# Patient Record
Sex: Female | Born: 2000 | Race: Black or African American | Hispanic: No | Marital: Single | Smoking: Never smoker
Health system: Southern US, Community
[De-identification: ages and names within clinical notes are randomized; demographics above are authoritative.]

## PROBLEM LIST (undated history)

## (undated) DIAGNOSIS — Z789 Other specified health status: Secondary | ICD-10-CM

## (undated) HISTORY — DX: Other specified health status: Z78.9

## (undated) HISTORY — PX: NO PAST SURGERIES: SHX2092

---
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
## 2018-01-10 ENCOUNTER — Telehealth: Payer: Self-pay | Admitting: Licensed Clinical Social Worker

## 2018-01-10 ENCOUNTER — Ambulatory Visit: Payer: Self-pay | Admitting: Obstetrics and Gynecology

## 2018-01-10 NOTE — Telephone Encounter (Signed)
CSW A. Felton Clinton called phone number on file to reschedule appt. Unable to reach patient and could not leave voicemail for callback.

## 2018-05-10 ENCOUNTER — Encounter (HOSPITAL_COMMUNITY): Payer: Self-pay | Admitting: Emergency Medicine

## 2018-05-10 ENCOUNTER — Emergency Department (HOSPITAL_COMMUNITY)
Admission: EM | Admit: 2018-05-10 | Discharge: 2018-05-11 | Disposition: A | Discharge status: Home / Self Care | Payer: Self-pay | Attending: Emergency Medicine | Admitting: Emergency Medicine

## 2018-05-10 ENCOUNTER — Other Ambulatory Visit: Payer: Self-pay

## 2018-05-10 DIAGNOSIS — Z789 Other specified health status: Secondary | ICD-10-CM

## 2018-05-10 DIAGNOSIS — N83201 Unspecified ovarian cyst, right side: Secondary | ICD-10-CM | POA: Insufficient documentation

## 2018-05-10 LAB — CBC WITH DIFFERENTIAL/PLATELET
Abs Immature Granulocytes: 0.02 10*3/uL (ref 0.00–0.07)
Basophils Absolute: 0 10*3/uL (ref 0.0–0.1)
Basophils Relative: 0 %
Eosinophils Absolute: 0.1 10*3/uL (ref 0.0–0.5)
Eosinophils Relative: 1 %
HCT: 40.7 % (ref 36.0–46.0)
HEMOGLOBIN: 13.1 g/dL (ref 12.0–15.0)
Immature Granulocytes: 0 %
Lymphocytes Relative: 36 %
Lymphs Abs: 2.9 10*3/uL (ref 0.7–4.0)
MCH: 32 pg (ref 26.0–34.0)
MCHC: 32.2 g/dL (ref 30.0–36.0)
MCV: 99.5 fL (ref 80.0–100.0)
MONOS PCT: 9 %
Monocytes Absolute: 0.7 10*3/uL (ref 0.1–1.0)
Neutro Abs: 4.3 10*3/uL (ref 1.7–7.7)
Neutrophils Relative %: 54 %
Platelets: 251 10*3/uL (ref 150–400)
RBC: 4.09 MIL/uL (ref 3.87–5.11)
RDW: 11.6 % (ref 11.5–15.5)
WBC: 8 10*3/uL (ref 4.0–10.5)
nRBC: 0 % (ref 0.0–0.2)

## 2018-05-10 LAB — URINALYSIS, ROUTINE W REFLEX MICROSCOPIC
Bilirubin Urine: NEGATIVE
Glucose, UA: NEGATIVE mg/dL
Hgb urine dipstick: NEGATIVE
Ketones, ur: NEGATIVE mg/dL
Leukocytes, UA: NEGATIVE
Nitrite: NEGATIVE
Protein, ur: NEGATIVE mg/dL
Specific Gravity, Urine: 1.026 (ref 1.005–1.030)
pH: 6 (ref 5.0–8.0)

## 2018-05-10 LAB — BASIC METABOLIC PANEL
Anion gap: 7 (ref 5–15)
BUN: 15 mg/dL (ref 6–20)
CO2: 23 mmol/L (ref 22–32)
Calcium: 9.1 mg/dL (ref 8.9–10.3)
Chloride: 109 mmol/L (ref 98–111)
Creatinine, Ser: 0.77 mg/dL (ref 0.44–1.00)
GFR calc non Af Amer: >60 mL/min (ref >60)
Glucose, Bld: 99 mg/dL (ref 70–99)
Potassium: 4.1 mmol/L (ref 3.5–5.1)
SODIUM: 139 mmol/L (ref 135–145)

## 2018-05-10 LAB — I-STAT BETA HCG BLOOD, ED (MC, WL, AP ONLY): I-stat hCG, quantitative: <5 m[IU]/mL (ref <5)

## 2018-05-10 LAB — WET PREP, GENITAL
Clue Cells Wet Prep HPF POC: NONE SEEN
SPERM: NONE SEEN
Trich, Wet Prep: NONE SEEN
WBC, Wet Prep HPF POC: NONE SEEN
Yeast Wet Prep HPF POC: NONE SEEN

## 2018-05-10 NOTE — ED Provider Notes (Signed)
[facility]-EMERGENCY DEPT Provider Note   CSN: [id number] Arrival date & time: 05/10/18  1949     History   Chief Complaint Chief Complaint  Patient presents with  . Vaginal Bleeding    HPI Judith Evans is a 18 y.o. female who presents the emergency department with chief complaint of right quadrant abdominal pain.  Patient states that she had an irregularly heavy.  Which started on 04/27/2018.  She states that her periods are usually irregular.  She does not have any active vaginal bleeding.  She has had 2 days of mild to moderate colicky in nature right lower quadrant abdominal pain.  Nothing seems to make it worse or better.  She denies flank pain or urinary symptoms.  She denies nausea vomiting fever or chills.  Patient denies vaginal symptoms such as foul odor or discharge.  HPI  History reviewed. No pertinent past medical history.  There are no active problems to display for this patient.   History reviewed. No pertinent surgical history.   OB History   No obstetric history on file.      Home Medications    Prior to Admission medications   Medication Sig Start Date End Date Taking? Authorizing Provider  naproxen sodium (ALEVE) 220 MG tablet Take 440 mg by mouth 2 (two) times daily as needed (pain).   Yes [provider]    Family History No family history on file.  Social History Social History   Tobacco Use  . Smoking status: Not on file  Substance Use Topics  . Alcohol use: Not on file  . Drug use: Not on file     Allergies   Patient has no known allergies.   Review of Systems Review of Systems  Ten systems reviewed and are negative for acute change, except as noted in the HPI.   Physical Exam Updated Vital Signs BP 118/72 (BP Location: Left Arm)   Pulse 78   Temp 98.7 F (37.1 C) (Oral)   Resp 14   Ht 4\' 11"  (1.499 m)   Wt 47.6 kg   LMP 04/27/2018   SpO2 100%   BMI 21.21 kg/m   Physical Exam Physical  Exam  Nursing note and vitals reviewed. Constitutional: She is oriented to person, place, and time. She appears well-developed and well-nourished. No distress.  HENT:  Head: Normocephalic and atraumatic.  Eyes: Conjunctivae normal and EOM are normal. Pupils are equal, round, and reactive to light. No scleral icterus.  Neck: Normal range of motion.  Cardiovascular: Normal rate, regular rhythm and normal heart sounds.  Exam reveals no gallop and no friction rub.   No murmur heard. Pulmonary/Chest: Effort normal and breath sounds normal. No respiratory distress.  Abdominal: Soft. Bowel sounds are normal. She exhibits no distension and no mass. There is no tenderness. There is no guarding.  Neurological: She is alert and oriented to person, place, and time.Female genitalia: Vulva: normal appearing vulva with no masses, tenderness or lesions Vagina: normal appearing vagina with normal color and discharge, no lesions Cervix: normal appearing cervix without discharge or lesions, cervical motion tenderness absent and nulliparous os Adnexa: no masses and tenderness right   Skin: Skin is warm and dry. She is not diaphoretic.     ED Treatments / Results  Labs (all labs ordered are listed, but only abnormal results are displayed) Labs Reviewed  URINALYSIS, ROUTINE W REFLEX MICROSCOPIC - Abnormal; Notable for the following components:      Result Value  APPearance HAZY (*)    All other components within normal limits  WET PREP, GENITAL  CBC WITH DIFFERENTIAL/PLATELET  BASIC METABOLIC PANEL  I-STAT BETA HCG BLOOD, ED ([facility], [facility], [facility] ONLY)  GC/CHLAMYDIA PROBE AMP () NOT AT [facility]    EKG None  Radiology No results found.  Procedures Procedures (including critical care time)  Medications Ordered in ED Medications - No data to display   Initial Impression / Assessment and Plan / ED Course  I have reviewed the triage vital signs and the nursing notes.  Pertinent labs & imaging  results that were available during my care of the patient were reviewed by me and considered in my medical decision making (see chart for details).     18 year old female with right lower quadrant abdominal pain. Differential diagnosis of her lower abdominal considerations include pelvic inflammatory disease, ectopic pregnancy, appendicitis, urinary calculi, primary dysmenorrhea, septic abortion, ruptured ovarian cyst or tumor, ovarian torsion, tubo-ovarian abscess, degeneration of fibroid, endometriosis, diverticulitis, cystitis. Her abdominal examination is benign.  Her right right adnexa was tender without masses no cervical motion tenderness doubt PID or tubo-ovarian abscess.  Ultrasound performed which shows right ovarian cyst which is likely the cause of her pain.  Labs reviewed and showed no significant abnormalities.  Normal wet prep and patient has negative pregnancy examination.  Final Clinical Impressions(s) / ED Diagnoses   Final diagnoses:  None    ED Discharge Orders    None       Arthor Captain, PA-C 05/11/18 3833    Tegeler, Canary Brim, MD 05/11/18 1023

## 2018-05-10 NOTE — ED Triage Notes (Signed)
Pt arriving with complaint of right sided abdominal pain and reports vaginal "blood clots".

## 2018-05-11 ENCOUNTER — Emergency Department (HOSPITAL_COMMUNITY): Payer: Self-pay

## 2018-05-11 LAB — GC/CHLAMYDIA PROBE AMP (~~LOC~~) NOT AT ARMC
Chlamydia: POSITIVE — AB
Neisseria Gonorrhea: NEGATIVE

## 2018-05-11 NOTE — Discharge Instructions (Addendum)
Get help right away if: You have abdominal pain that is severe or gets worse. You cannot eat or drink without vomiting. You suddenly develop a fever. Your menstrual period is much heavier than usual 

## 2018-05-13 ENCOUNTER — Telehealth: Payer: Self-pay | Admitting: Medical

## 2018-05-13 DIAGNOSIS — A749 Chlamydial infection, unspecified: Secondary | ICD-10-CM

## 2018-05-13 MED ORDER — AZITHROMYCIN 250 MG PO TABS: 1000.0000 mg | ORAL_TABLET | Freq: Once | ORAL | 0 refills | Status: AC

## 2018-05-13 NOTE — Telephone Encounter (Addendum)
Judith Evans tested positive for  Chlamydia. Patient was called by RN and allergies and pharmacy confirmed. Rx sent to pharmacy of choice.   Marny Lowenstein, PA-C 05/13/2018 11:43 AM       ----- Message from Judith Becton, RN sent at 05/13/2018 10:57 AM EST ----- This patient tested positive for:  Chlamydia  She "has NKDA", I have informed the patient of her results and confirmed her pharmacy is correct in her chart. Please send Rx.   Thank you,   Judith Becton, RN   Results faxed to [facility].

## 2018-05-19 ENCOUNTER — Other Ambulatory Visit: Payer: Self-pay

## 2018-05-19 ENCOUNTER — Encounter (HOSPITAL_COMMUNITY): Payer: Self-pay | Admitting: *Deleted

## 2018-05-19 ENCOUNTER — Emergency Department (HOSPITAL_COMMUNITY)
Admission: EM | Admit: 2018-05-19 | Discharge: 2018-05-19 | Disposition: A | Discharge status: Home / Self Care | Payer: Medicaid Other | Attending: Emergency Medicine | Admitting: Emergency Medicine

## 2018-05-19 DIAGNOSIS — R1030 Lower abdominal pain, unspecified: Secondary | ICD-10-CM | POA: Insufficient documentation

## 2018-05-19 DIAGNOSIS — R110 Nausea: Secondary | ICD-10-CM | POA: Insufficient documentation

## 2018-05-19 MED ORDER — CEFTRIAXONE SODIUM 250 MG IJ SOLR
250.0000 mg | Freq: Once | INTRAMUSCULAR | Status: AC
  Administered 2018-05-19: 250 mg via INTRAMUSCULAR
  Filled 2018-05-19: qty 250

## 2018-05-19 MED ORDER — DOXYCYCLINE HYCLATE 100 MG PO CAPS: 100.0000 mg | ORAL_CAPSULE | Freq: Two times a day (BID) | ORAL | 0 refills | Status: AC

## 2018-05-19 MED ORDER — LIDOCAINE HCL 1 % IJ SOLN
INTRAMUSCULAR | Status: AC
  Administered 2018-05-19: 0.9 mL
  Filled 2018-05-19: qty 20

## 2018-05-19 NOTE — Discharge Instructions (Signed)
Take doxycycline as prescribed until finished for management of presumed pelvic inflammatory disease.  Continue naproxen for pain control.  Follow-up with [facility] as scheduled.

## 2018-05-19 NOTE — ED Provider Notes (Signed)
[facility]-EMERGENCY DEPT Provider Note   CSN: [id number] Arrival date & time: 05/19/18  7026     History   Chief Complaint No chief complaint on file.   HPI Judith Evans is a 18 y.o. female.  18 year old female with no significant past medical history presents to the emergency department for evaluation of persistent lower abdominal cramping.  She states that cramping has been intermittent since previously seen on 05/11/2018.  She has been taking Aleve for symptoms with little relief.  Symptoms have been associated with nausea without vomiting.  She had some vaginal bleeding when seen previously which has resolved.  During this encounter, patient underwent pelvic ultrasound which showed a 2 cm simple ovarian cyst.  She took 1000 mg azithromycin on 05/13/2018 after tests returned positive for chlamydia.  She has not been sexually active since last evaluated.  Denies any fevers, bowel changes, dysuria, hematuria.  No history of abdominal surgeries.  The history is provided by the patient. No language interpreter was used.    History reviewed. No pertinent past medical history.  There are no active problems to display for this patient.   History reviewed. No pertinent surgical history.   OB History   No obstetric history on file.      Home Medications    Prior to Admission medications   Medication Sig Start Date End Date Taking? Authorizing Provider  naproxen sodium (ALEVE) 220 MG tablet Take 440 mg by mouth 2 (two) times daily as needed (pain).   Yes [provider]  doxycycline (VIBRAMYCIN) 100 MG capsule Take 1 capsule (100 mg total) by mouth 2 (two) times daily for 14 days. 05/19/18 06/02/18  Antony Madura, PA-C    Family History No family history on file.  Social History Social History   Tobacco Use  . Smoking status: Never Smoker  . Smokeless tobacco: Never Used  Substance Use Topics  . Alcohol use: Never    Frequency: Never  . Drug  use: Never     Allergies   Patient has no known allergies.   Review of Systems Review of Systems Ten systems reviewed and are negative for acute change, except as noted in the HPI.    Physical Exam Updated Vital Signs BP 113/80 (BP Location: Left Arm)   Pulse 88   Temp 98.6 F (37 C) (Oral)   Resp 18   Ht 4\' 11"  (1.499 m)   Wt 47.6 kg   LMP 04/27/2018   SpO2 97%   BMI 21.21 kg/m   Physical Exam Vitals signs and nursing note reviewed.  Constitutional:      General: She is not in acute distress.    Appearance: She is well-developed. She is not diaphoretic.     Comments: Nontoxic appearing and in NAD  HENT:     Head: Normocephalic and atraumatic.  Eyes:     General: No scleral icterus.    Conjunctiva/sclera: Conjunctivae normal.  Neck:     Musculoskeletal: Normal range of motion.  Cardiovascular:     Rate and Rhythm: Normal rate and regular rhythm.     Pulses: Normal pulses.  Pulmonary:     Effort: Pulmonary effort is normal. No respiratory distress.     Comments: Respirations even and unlabored Abdominal:     General: There is no distension.     Palpations: Abdomen is soft. There is no mass.     Tenderness: There is no guarding.       Comments: Abdomen soft, nondistended.  Minimal TTP in the right suprapubic abdomen.  Musculoskeletal: Normal range of motion.  Skin:    General: Skin is warm and dry.     Coloration: Skin is not pale.     Findings: No erythema or rash.  Neurological:     Mental Status: She is alert and oriented to person, place, and time.     Coordination: Coordination normal.  Psychiatric:        Behavior: Behavior normal.      ED Treatments / Results  Labs (all labs ordered are listed, but only abnormal results are displayed) Labs Reviewed - No data to display  EKG None  Radiology No results found.  Procedures Procedures (including critical care time)  Medications Ordered in ED Medications  cefTRIAXone (ROCEPHIN)  injection 250 mg (has no administration in time range)     Initial Impression / Assessment and Plan / ED Course  I have reviewed the triage vital signs and the nursing notes.  Pertinent labs & imaging results that were available during my care of the patient were reviewed by me and considered in my medical decision making (see chart for details).     18 year old female presents for persistent lower abdominal pain.  She was given 1000 mg azithromycin on 05/13/2018 after she tested positive for chlamydia.  Also noted to have right simple ovarian cyst on ultrasound from 05/11/2018.  She has a soft, nondistended abdomen.  There is minimal tenderness in the right suprapubic abdomen.  She has no focal tenderness at McBurney's point.  Laboratory tests from 1 week ago reviewed which were reassuring.  She denies any urinary symptoms.  She had a previously negative pregnancy test and has not been sexually active since her last encounter.  Given her ongoing lower abdominal cramping, plan to treat for presumed PID.  She has been given a dose of Rocephin in the emergency department.  Will discharge with doxycycline.  Patient encouraged to continue use of naproxen.  [facility]  Return precautions discussed and provided. Patient discharged in stable condition with no unaddressed concerns.   Final Clinical Impressions(s) / ED Diagnoses   Final diagnoses:  Lower abdominal pain    ED Discharge Orders         Ordered    doxycycline (VIBRAMYCIN) 100 MG capsule  2 times daily     05/19/18 0207           Antony MaduraHumes, Sutton Plake, PA-C 05/19/18 0224    Marily MemosMesner, Jason, MD 05/19/18 [phone]

## 2018-05-19 NOTE — ED Triage Notes (Signed)
Pt states she was seen last week and told she had cysts on her ovaries.  She was told that if she still had pain or had nausea she should come back.  Pt is here for her pain that has not changed since she was last seen. Pt states that she can't get in with [facility] until February. Pt reports nausea without emesis.  Rates pain a 4 out of 10.

## 2018-09-15 DIAGNOSIS — N83201 Unspecified ovarian cyst, right side: Secondary | ICD-10-CM | POA: Insufficient documentation

## 2018-09-20 ENCOUNTER — Emergency Department (HOSPITAL_COMMUNITY)
Admission: EM | Admit: 2018-09-20 | Discharge: 2018-09-20 | Disposition: A | Discharge status: Home / Self Care | Payer: Self-pay | Attending: Emergency Medicine | Admitting: Emergency Medicine

## 2018-09-20 ENCOUNTER — Emergency Department (HOSPITAL_COMMUNITY): Payer: Self-pay

## 2018-09-20 ENCOUNTER — Other Ambulatory Visit: Payer: Self-pay

## 2018-09-20 ENCOUNTER — Encounter (HOSPITAL_COMMUNITY): Payer: Self-pay | Admitting: Emergency Medicine

## 2018-09-20 DIAGNOSIS — R109 Unspecified abdominal pain: Secondary | ICD-10-CM

## 2018-09-20 DIAGNOSIS — R1031 Right lower quadrant pain: Secondary | ICD-10-CM | POA: Insufficient documentation

## 2018-09-20 LAB — URINALYSIS, ROUTINE W REFLEX MICROSCOPIC
Bilirubin Urine: NEGATIVE
Glucose, UA: NEGATIVE mg/dL
Hgb urine dipstick: NEGATIVE
Ketones, ur: 20 mg/dL — AB
Leukocytes,Ua: NEGATIVE
Nitrite: NEGATIVE
Protein, ur: NEGATIVE mg/dL
Specific Gravity, Urine: 1.027 (ref 1.005–1.030)
pH: 6 (ref 5.0–8.0)

## 2018-09-20 LAB — WET PREP, GENITAL
Clue Cells Wet Prep HPF POC: NONE SEEN
Sperm: NONE SEEN
Trich, Wet Prep: NONE SEEN
Yeast Wet Prep HPF POC: NONE SEEN

## 2018-09-20 LAB — I-STAT BETA HCG BLOOD, ED (MC, WL, AP ONLY): I-stat hCG, quantitative: <5 m[IU]/mL (ref <5)

## 2018-09-20 LAB — CBC
HCT: 40.2 % (ref 36.0–46.0)
Hemoglobin: 12.8 g/dL (ref 12.0–15.0)
MCH: 31.8 pg (ref 26.0–34.0)
MCHC: 31.8 g/dL (ref 30.0–36.0)
MCV: 100 fL (ref 80.0–100.0)
Platelets: 234 10*3/uL (ref 150–400)
RBC: 4.02 MIL/uL (ref 3.87–5.11)
RDW: 11.3 % — ABNORMAL LOW (ref 11.5–15.5)
WBC: 5.1 10*3/uL (ref 4.0–10.5)
nRBC: 0 % (ref 0.0–0.2)

## 2018-09-20 LAB — COMPREHENSIVE METABOLIC PANEL
ALT: 11 U/L (ref 0–44)
AST: 16 U/L (ref 15–41)
Albumin: 4.3 g/dL (ref 3.5–5.0)
Alkaline Phosphatase: 34 U/L — ABNORMAL LOW (ref 38–126)
Anion gap: 7 (ref 5–15)
BUN: 13 mg/dL (ref 6–20)
CO2: 22 mmol/L (ref 22–32)
Calcium: 8.7 mg/dL — ABNORMAL LOW (ref 8.9–10.3)
Chloride: 108 mmol/L (ref 98–111)
Creatinine, Ser: 0.77 mg/dL (ref 0.44–1.00)
GFR calc Af Amer: >60 mL/min (ref >60)
GFR calc non Af Amer: >60 mL/min (ref >60)
Glucose, Bld: 94 mg/dL (ref 70–99)
Potassium: 3.4 mmol/L — ABNORMAL LOW (ref 3.5–5.1)
Sodium: 137 mmol/L (ref 135–145)
Total Bilirubin: 0.7 mg/dL (ref 0.3–1.2)
Total Protein: 7.9 g/dL (ref 6.5–8.1)

## 2018-09-20 LAB — LIPASE, BLOOD: Lipase: 28 U/L (ref 11–51)

## 2018-09-20 MED ORDER — IOHEXOL 300 MG/ML  SOLN
30.0000 mL | Freq: Once | INTRAMUSCULAR | Status: AC | PRN
  Administered 2018-09-20: 30 mL via ORAL

## 2018-09-20 MED ORDER — ACETAMINOPHEN 500 MG PO TABS
1000.0000 mg | ORAL_TABLET | Freq: Once | ORAL | Status: AC
  Administered 2018-09-20: 1000 mg via ORAL
  Filled 2018-09-20: qty 2

## 2018-09-20 MED ORDER — IOHEXOL 300 MG/ML  SOLN
100.0000 mL | Freq: Once | INTRAMUSCULAR | Status: AC | PRN
  Administered 2018-09-20: 80 mL via INTRAVENOUS

## 2018-09-20 NOTE — ED Provider Notes (Signed)
[facility]-EMERGENCY DEPT Provider Note   CSN: [id number] Arrival date & time: 09/20/18  1429    History   Chief Complaint Chief Complaint  Patient presents with   Abdominal Pain    HPI Judith Evans is a 18 y.o. female.     HPI   Patient is a 18 year old female with past medical history of PID and ovarian cysts presenting for right lower quadrant and suprapubic abdominal pain.  Patient reports that it is been colicky in nature since yesterday afternoon.  She describes it as sharp when it is present.  Patient denies any dysuria, urgency or frequency.  She does report some slight increase in vaginal discharge for the past couple weeks.  Patient denies nausea vomiting but does report decreased appetite.  Reports last bowel movement yesterday and normal for patient.  Patient reports that she is sexually active with one female partner and performs unprotected vaginal sex.  Patient states that her menses have been irregular, but menses finished yesterday.  She is recently started back on oral contraceptive pills.  Denies abdominal surgical history.  History reviewed. No pertinent past medical history.  There are no active problems to display for this patient.   History reviewed. No pertinent surgical history.   OB History   No obstetric history on file.      Home Medications    Prior to Admission medications   Medication Sig Start Date End Date Taking? Authorizing Provider  CRYSELLE-28 0.3-30 MG-MCG tablet Take 1 tablet by mouth daily. 09/15/18  Yes [provider]    Family History No family history on file.  Social History Social History   Tobacco Use   Smoking status: Never Smoker   Smokeless tobacco: Never Used  Substance Use Topics   Alcohol use: Never    Frequency: Never   Drug use: Never     Allergies   Patient has no known allergies.   Review of Systems Review of Systems  Constitutional: Negative for chills and  fever.  Gastrointestinal: Positive for abdominal pain. Negative for abdominal distention, blood in stool, diarrhea, nausea and vomiting.  Genitourinary: Positive for menstrual problem, pelvic pain and vaginal discharge. Negative for dysuria, flank pain, frequency, urgency and vaginal bleeding.  All other systems reviewed and are negative.    Physical Exam Updated Vital Signs BP 107/63 (BP Location: Right Arm)    Pulse 87    Temp 98.9 F (37.2 C) (Oral)    Resp 15    SpO2 100%   Physical Exam Vitals signs and nursing note reviewed.  Constitutional:      General: She is not in acute distress.    Appearance: She is well-developed.  HENT:     Head: Normocephalic and atraumatic.  Eyes:     Conjunctiva/sclera: Conjunctivae normal.     Pupils: Pupils are equal, round, and reactive to light.  Neck:     Musculoskeletal: Normal range of motion and neck supple.  Cardiovascular:     Rate and Rhythm: Normal rate and regular rhythm.     Heart sounds: S1 normal and S2 normal. No murmur.  Pulmonary:     Effort: Pulmonary effort is normal.     Breath sounds: Normal breath sounds. No wheezing or rales.  Abdominal:     General: There is no distension.     Palpations: Abdomen is soft.     Tenderness: There is abdominal tenderness in the right lower quadrant. There is no guarding.     Comments:  Mild RLQ tenderness without guarding or rebound.   Genitourinary:    Comments: GU exam performed with RN chaperone present.  Patient has mobile horizontal chain inguinal lymphadenopathy.  Vaginal tissue pink and rugated.  Cervical os nonerythematous.  Small amount of vaginal discharge present in vaginal vault. On bimanual exam, patient has no cervical motion tenderness, but does have right adnexal TTP.  Musculoskeletal: Normal range of motion.        General: No deformity.  Lymphadenopathy:     Cervical: No cervical adenopathy.  Skin:    General: Skin is warm and dry.     Findings: No erythema or rash.    Neurological:     Mental Status: She is alert.     Comments: Cranial nerves grossly intact. Patient moves extremities symmetrically and with good coordination.  Psychiatric:        Behavior: Behavior normal.        Thought Content: Thought content normal.        Judgment: Judgment normal.      ED Treatments / Results  Labs (all labs ordered are listed, but only abnormal results are displayed) Labs Reviewed  WET PREP, GENITAL - Abnormal; Notable for the following components:      Result Value   WBC, Wet Prep HPF POC RARE (*)    All other components within normal limits  COMPREHENSIVE METABOLIC PANEL - Abnormal; Notable for the following components:   Potassium 3.4 (*)    Calcium 8.7 (*)    Alkaline Phosphatase 34 (*)    All other components within normal limits  CBC - Abnormal; Notable for the following components:   RDW 11.3 (*)    All other components within normal limits  URINALYSIS, ROUTINE W REFLEX MICROSCOPIC - Abnormal; Notable for the following components:   APPearance HAZY (*)    Ketones, ur 20 (*)    All other components within normal limits  LIPASE, BLOOD  RPR  HIV ANTIBODY (ROUTINE TESTING W REFLEX)  I-STAT BETA HCG BLOOD, ED ([facility], [facility], [facility] ONLY)  GC/CHLAMYDIA PROBE AMP ([place]) NOT AT [facility]    EKG None  Radiology US Transvaginal Non-ob  Result Date: 09/20/2018 CLINICAL DATA:  Initial evaluation for acute right adnexal pain. EXAM: TRANSABDOMINAL AND TRANSVAGINAL ULTRASOUND OF PELVIS DOPPLER ULTRASOUND OF OVARIES TECHNIQUE: Both transabdominal and transvaginal ultrasound examinations of the pelvis were performed. Transabdominal technique was performed for global imaging of the pelvis including uterus, ovaries, adnexal regions, and pelvic cul-de-sac. It was necessary to proceed with endovaginal exam following the transabdominal exam to visualize the uterus, endometrium, and ovaries. Color and duplex Doppler ultrasound was utilized to evaluate blood flow to the  ovaries. COMPARISON:  Prior ultrasound from 05/11/2018 FINDINGS: Uterus Measurements: 7.7 x 3.5 x 5.5 cm = volume: 77.1 mL. No fibroids or other mass visualized. Endometrium Thickness: 4.2 mm.  No focal abnormality visualized. Right ovary Measurements: 2.6 x 1.8 x 1.6 cm = volume: 3.9 mL. Normal appearance/no adnexal mass. Left ovary Measurements: 3.3 x 1.3 x 1.8 cm = volume: 4.1 mL. Normal appearance/no adnexal mass. Pulsed Doppler evaluation of both ovaries demonstrates normal low-resistance arterial and venous waveforms. Other findings Trace free physiologic fluid present within the pelvis. IMPRESSION: Normal pelvic ultrasound. No evidence for torsion or other acute abnormality. Electronically Signed   By: Rise Mu M.D.   On: 09/20/2018 18:03   US Pelvis Complete  Result Date: 09/20/2018 CLINICAL DATA:  Initial evaluation for acute right adnexal pain. EXAM: TRANSABDOMINAL AND TRANSVAGINAL ULTRASOUND  OF PELVIS DOPPLER ULTRASOUND OF OVARIES TECHNIQUE: Both transabdominal and transvaginal ultrasound examinations of the pelvis were performed. Transabdominal technique was performed for global imaging of the pelvis including uterus, ovaries, adnexal regions, and pelvic cul-de-sac. It was necessary to proceed with endovaginal exam following the transabdominal exam to visualize the uterus, endometrium, and ovaries. Color and duplex Doppler ultrasound was utilized to evaluate blood flow to the ovaries. COMPARISON:  Prior ultrasound from 05/11/2018 FINDINGS: Uterus Measurements: 7.7 x 3.5 x 5.5 cm = volume: 77.1 mL. No fibroids or other mass visualized. Endometrium Thickness: 4.2 mm.  No focal abnormality visualized. Right ovary Measurements: 2.6 x 1.8 x 1.6 cm = volume: 3.9 mL. Normal appearance/no adnexal mass. Left ovary Measurements: 3.3 x 1.3 x 1.8 cm = volume: 4.1 mL. Normal appearance/no adnexal mass. Pulsed Doppler evaluation of both ovaries demonstrates normal low-resistance arterial and venous  waveforms. Other findings Trace free physiologic fluid present within the pelvis. IMPRESSION: Normal pelvic ultrasound. No evidence for torsion or other acute abnormality. Electronically Signed   By: Rise Mu M.D.   On: 09/20/2018 18:03   [place] Art/ven Flow Abd Pelv Doppler  Result Date: 09/20/2018 CLINICAL DATA:  Initial evaluation for acute right adnexal pain. EXAM: TRANSABDOMINAL AND TRANSVAGINAL ULTRASOUND OF PELVIS DOPPLER ULTRASOUND OF OVARIES TECHNIQUE: Both transabdominal and transvaginal ultrasound examinations of the pelvis were performed. Transabdominal technique was performed for global imaging of the pelvis including uterus, ovaries, adnexal regions, and pelvic cul-de-sac. It was necessary to proceed with endovaginal exam following the transabdominal exam to visualize the uterus, endometrium, and ovaries. Color and duplex Doppler ultrasound was utilized to evaluate blood flow to the ovaries. COMPARISON:  Prior ultrasound from 05/11/2018 FINDINGS: Uterus Measurements: 7.7 x 3.5 x 5.5 cm = volume: 77.1 mL. No fibroids or other mass visualized. Endometrium Thickness: 4.2 mm.  No focal abnormality visualized. Right ovary Measurements: 2.6 x 1.8 x 1.6 cm = volume: 3.9 mL. Normal appearance/no adnexal mass. Left ovary Measurements: 3.3 x 1.3 x 1.8 cm = volume: 4.1 mL. Normal appearance/no adnexal mass. Pulsed Doppler evaluation of both ovaries demonstrates normal low-resistance arterial and venous waveforms. Other findings Trace free physiologic fluid present within the pelvis. IMPRESSION: Normal pelvic ultrasound. No evidence for torsion or other acute abnormality. Electronically Signed   By: Rise Mu M.D.   On: 09/20/2018 18:03    Procedures Procedures (including critical care time)  Medications Ordered in ED Medications  acetaminophen (TYLENOL) tablet 1,000 mg (1,000 mg Oral Given 09/20/18 1815)     Initial Impression / Assessment and Plan / ED Course  I have reviewed  the triage vital signs and the nursing notes.  Pertinent labs & imaging results that were available during my care of the patient were reviewed by me and considered in my medical decision making (see chart for details).  Clinical Course as of Sep 19 2144  Tue Sep 20, 2018  1715 Exam not c/w PID.   WBC, Wet Prep HPF POC(!): RARE [AM]  2046 Reports improvement in pain.   [AM]    Clinical Course User Index [AM] Elisha Ponder, PA-C       Patient is a well-appearing 18 year old female with past medical history of PID, chlamydia, and ovarian cyst presenting for ongoing right lower quadrant pain.  Differential diagnosis includes ruptured ovarian cyst, ovarian torsion, ureterolithiasis, appendicitis, PID, gastroenteritis.  She has a benign abdominal exam.  Pelvic examination is not concerning for PID.  Patient's work-up is demonstrating no leukocytosis.  She has a  slight hypokalemia of 3.4.  Urinalysis is negative without evidence of infection.  Wet prep demonstrating very few WBCs and no other acute findings.  Not consistent with PID or cervicitis.  Pelvic ultrasound unremarkable.  CT abdomen and pelvis without evidence of acute findings.  No findings to explain patient's right lower quadrant pain.  She did have improvement in emergency department course with Tylenol.  She was requesting POs, and tolerated well.   Patient can return precautions for any worsening pain, intractable nausea or vomiting, or fever with abdominal pain.  Patient is in understanding and agrees with plan of care.  Final Clinical Impressions(s) / ED Diagnoses   Final diagnoses:  Right sided abdominal pain    ED Discharge Orders    None       Delia ChimesMurray, Joyce Heitman B, PA-C 09/20/18 2146    Sabas SousBero, Michael M, MD 09/20/18 2318

## 2018-09-20 NOTE — ED Triage Notes (Signed)
Pt reports since last night started having right side abd pains again. Wants to know if her cyst is back again.

## 2018-09-20 NOTE — Discharge Instructions (Signed)
Please see the information and instructions below regarding your visit.  Your diagnoses today include:  1. RLQ abdominal pain     Your exam and testing today is reassuring that there is not a condition causing your abdominal pain that we immediately need to intervene on at this time.   Abdominal (belly) pain can be caused by many things. Your caregiver performed an examination and possibly ordered blood/urine tests and imaging (CT scan, x-rays, ultrasound). Many cases can be observed and treated at home after initial evaluation in the emergency department. Even though you are being discharged home, abdominal pain can be unpredictable. Therefore, you need a repeated exam if your pain does not resolve, returns, or worsens. Most patients with abdominal pain don't have to be admitted to the hospital or have surgery, but serious problems like appendicitis and gallbladder attacks can start out as nonspecific pain. Many abdominal conditions cannot be diagnosed in one visit, so follow-up evaluations are very important.  Tests performed today include: Blood counts and electrolytes Blood tests to check liver and kidney function Blood tests to check pancreas function Urine test to look for infection and pregnancy (in women) Vital signs. See below for your results today.   See side panel of your discharge paperwork for testing performed today. Vital signs are listed at the bottom of these instructions.   Medications prescribed:    Take any prescribed medications only as prescribed, and any over the counter medications only as directed on the packaging.  I recommend taking Gas-X (Simethicone) as needed.  This is an over-the-counter medication.  This helps disperse gas bubbles that they are the cause of your pain.  You may also take Tylenol, 650 mg every 6 hours as needed for pain.  Home care instructions:  Try eating, but start with foods that have a lot of fluid in them. Good examples are soup,  Jell-O, and popsicles. If you do OK with those foods, you can try soft, bland foods, such as plain yogurt. Foods that are high in carbohydrates ("carbs"), like bread or saltine crackers, can help settle your stomach. Some people also find that ginger helps with nausea. You should avoid foods that have a lot of fat in them. They can make nausea worse. Call your doctor if your symptoms come back when you try to eat.  Please follow any educational materials contained in this packet.   Follow-up instructions: Please follow-up with your primary care provider in the next couple of days as needed for further evaluation of your symptoms if they are not completely improved.    Return instructions:  Please return to the Emergency Department if you experience worsening symptoms.  SEEK IMMEDIATE MEDICAL ATTENTION IF: The pain does not go away or becomes severe  A temperature above 101F develops  Repeated vomiting occurs (multiple episodes)  The pain becomes localized to portions of the abdomen. The right side could possibly be appendicitis. In an adult, the left lower portion of the abdomen could be colitis or diverticulitis.  Blood is being passed in stools or vomit (bright red or black tarry stools)  You develop chest pain, difficulty breathing, dizziness or fainting, or become confused, poorly responsive, or inconsolable (young children) If you have any other emergent concerns regarding your health  Additional Information:   Your vital signs today were: BP 103/67    Pulse 79    Temp 98.9 F (37.2 C) (Oral)    Resp 15    LMP 09/13/2018    SpO2  100%  If your blood pressure (BP) was elevated on multiple readings during this visit above 130 for the top number or above 80 for the bottom number, please have this repeated by your primary care provider within one month. --------------  Thank you for allowing us to participate in your care today.

## 2018-09-21 LAB — RPR: RPR Ser Ql: NONREACTIVE

## 2018-09-21 LAB — HIV ANTIBODY (ROUTINE TESTING W REFLEX): HIV Screen 4th Generation wRfx: NONREACTIVE

## 2018-09-22 LAB — GC/CHLAMYDIA PROBE AMP (~~LOC~~) NOT AT ARMC
Chlamydia: NEGATIVE
Neisseria Gonorrhea: NEGATIVE

## 2018-09-23 ENCOUNTER — Telehealth: Payer: Self-pay | Admitting: *Deleted

## 2018-09-23 NOTE — Telephone Encounter (Signed)
Pt called regarding her lab results from 09/20/2018. Advised her to activate her MyChart to see if her results are there and if not to called the ED at [facility] back regarding her lab results. Pt voiced understanding.

## 2018-10-27 ENCOUNTER — Emergency Department (HOSPITAL_COMMUNITY)
Admission: EM | Admit: 2018-10-27 | Discharge: 2018-10-28 | Disposition: A | Discharge status: Home / Self Care | Payer: Self-pay | Attending: Emergency Medicine | Admitting: Emergency Medicine

## 2018-10-27 ENCOUNTER — Other Ambulatory Visit: Payer: Self-pay

## 2018-10-27 DIAGNOSIS — R112 Nausea with vomiting, unspecified: Secondary | ICD-10-CM | POA: Insufficient documentation

## 2018-10-27 DIAGNOSIS — R102 Pelvic and perineal pain: Secondary | ICD-10-CM | POA: Insufficient documentation

## 2018-10-27 DIAGNOSIS — N899 Noninflammatory disorder of vagina, unspecified: Secondary | ICD-10-CM | POA: Insufficient documentation

## 2018-10-27 NOTE — ED Triage Notes (Signed)
Per pt she is having lower left abdominal pain with some nausea and vomiting today. Pt says it comes and goes. Left lower quadrant. Does not radiate but duid have some spotting two days ago. No period

## 2018-10-28 ENCOUNTER — Emergency Department (HOSPITAL_COMMUNITY): Payer: Self-pay

## 2018-10-28 LAB — CBC
HCT: 35.9 % — ABNORMAL LOW (ref 36.0–46.0)
Hemoglobin: 11.9 g/dL — ABNORMAL LOW (ref 12.0–15.0)
MCH: 32.2 pg (ref 26.0–34.0)
MCHC: 33.1 g/dL (ref 30.0–36.0)
MCV: 97 fL (ref 80.0–100.0)
Platelets: 266 10*3/uL (ref 150–400)
RBC: 3.7 MIL/uL — ABNORMAL LOW (ref 3.87–5.11)
RDW: 11.1 % — ABNORMAL LOW (ref 11.5–15.5)
WBC: 9.4 10*3/uL (ref 4.0–10.5)
nRBC: 0 % (ref 0.0–0.2)

## 2018-10-28 LAB — COMPREHENSIVE METABOLIC PANEL
ALT: 12 U/L (ref 0–44)
AST: 17 U/L (ref 15–41)
Albumin: 4 g/dL (ref 3.5–5.0)
Alkaline Phosphatase: 33 U/L — ABNORMAL LOW (ref 38–126)
Anion gap: 10 (ref 5–15)
BUN: 13 mg/dL (ref 6–20)
CO2: 22 mmol/L (ref 22–32)
Calcium: 8.7 mg/dL — ABNORMAL LOW (ref 8.9–10.3)
Chloride: 106 mmol/L (ref 98–111)
Creatinine, Ser: 0.91 mg/dL (ref 0.44–1.00)
GFR calc Af Amer: >60 mL/min (ref >60)
GFR calc non Af Amer: >60 mL/min (ref >60)
Glucose, Bld: 103 mg/dL — ABNORMAL HIGH (ref 70–99)
Potassium: 3.7 mmol/L (ref 3.5–5.1)
Sodium: 138 mmol/L (ref 135–145)
Total Bilirubin: 0.7 mg/dL (ref 0.3–1.2)
Total Protein: 7.2 g/dL (ref 6.5–8.1)

## 2018-10-28 LAB — I-STAT BETA HCG BLOOD, ED (MC, WL, AP ONLY): I-stat hCG, quantitative: <5 m[IU]/mL (ref <5)

## 2018-10-28 LAB — URINALYSIS, ROUTINE W REFLEX MICROSCOPIC
Bilirubin Urine: NEGATIVE
Glucose, UA: NEGATIVE mg/dL
Hgb urine dipstick: NEGATIVE
Ketones, ur: NEGATIVE mg/dL
Leukocytes,Ua: NEGATIVE
Nitrite: NEGATIVE
Protein, ur: NEGATIVE mg/dL
Specific Gravity, Urine: 1.019 (ref 1.005–1.030)
pH: 6 (ref 5.0–8.0)

## 2018-10-28 LAB — GC/CHLAMYDIA PROBE AMP (~~LOC~~) NOT AT ARMC
Chlamydia: NEGATIVE
Neisseria Gonorrhea: NEGATIVE

## 2018-10-28 LAB — WET PREP, GENITAL
Trich, Wet Prep: NONE SEEN
Yeast Wet Prep HPF POC: NONE SEEN

## 2018-10-28 LAB — LIPASE, BLOOD: Lipase: 30 U/L (ref 11–51)

## 2018-10-28 NOTE — Discharge Instructions (Addendum)
Your work-up in the emergency department was reassuring.  We recommend Tylenol or ibuprofen for management of ongoing discomfort.  May be due to constipation.  Try daily MiraLAX to ensure that you are stooling regularly.  Follow-up with your primary care doctor and/or an OB/GYN.  Return to the ED for any new or concerning symptoms.

## 2018-10-28 NOTE — ED Notes (Signed)
Patient transported to Ultrasound 

## 2018-10-28 NOTE — ED Provider Notes (Signed)
[facility] DEPART[facility] Provider Note   CSN: [id number] Arrival date & time: 10/27/18  2341    History   Chief Complaint Chief Complaint  Patient presents with  . Abdominal Pain    HPI Judith Evans is a 18 y.o. female.     The history is provided by the patient. No language interpreter was used.  Abdominal Pain Pain location:  LLQ Pain radiates to:  Does not radiate Pain severity:  Moderate Onset quality:  Sudden Timing:  Intermittent Progression:  Waxing and waning Chronicity:  New Context: recent sexual activity   Context: not sick contacts and not suspicious food intake   Relieved by:  Nothing Ineffective treatments:  None tried Associated symptoms: nausea, vaginal discharge and vomiting (x3 this AM)   Associated symptoms: no constipation, no cough, no diarrhea, no dysuria and no fever   Risk factors: has not had multiple surgeries   Risk factors comment:  Hx of unprotected sex   No past medical history on file.  There are no active problems to display for this patient.   No past surgical history on file.   OB History   No obstetric history on file.      Home Medications    Prior to Admission medications   Medication Sig Start Date End Date Taking? Authorizing Provider  CRYSELLE-28 0.3-30 MG-MCG tablet Take 1 tablet by mouth daily. 09/15/18   [provider]    Family History No family history on file.  Social History Social History   Tobacco Use  . Smoking status: Never Smoker  . Smokeless tobacco: Never Used  Substance Use Topics  . Alcohol use: Never    Frequency: Never  . Drug use: Never     Allergies   Patient has no known allergies.   Review of Systems Review of Systems  Constitutional: Negative for fever.  Respiratory: Negative for cough.   Gastrointestinal: Positive for abdominal pain, nausea and vomiting (x3 this AM). Negative for constipation and diarrhea.  Genitourinary: Positive for  vaginal discharge. Negative for dysuria.  Ten systems reviewed and are negative for acute change, except as noted in the HPI.    Physical Exam Updated Vital Signs BP 104/68 (BP Location: Right Arm)   Pulse 61   Temp 98.4 F (36.9 C) (Oral)   Resp 18   LMP 10/06/2018   SpO2 99%   Physical Exam Vitals signs and nursing note reviewed.  Constitutional:      General: She is not in acute distress.    Appearance: She is well-developed. She is not diaphoretic.     Comments: Nontoxic appearing and in NAD  HENT:     Head: Normocephalic and atraumatic.  Eyes:     General: No scleral icterus.    Conjunctiva/sclera: Conjunctivae normal.  Neck:     Musculoskeletal: Normal range of motion.  Cardiovascular:     Rate and Rhythm: Normal rate and regular rhythm.     Pulses: Normal pulses.  Pulmonary:     Effort: Pulmonary effort is normal. No respiratory distress.     Comments: Respirations even and unlabored Abdominal:     Comments: Soft, nontender, nondistended abdomen.  Genitourinary:    Comments: Normal GU exam. No genital sores or lesions. No vaginal discharge. Nontender on palpation of b/l adnexa, uterus. Musculoskeletal: Normal range of motion.  Skin:    General: Skin is warm and dry.     Coloration: Skin is not pale.     Findings: No  erythema or rash.  Neurological:     General: No focal deficit present.     Mental Status: She is alert and oriented to person, place, and time.     Coordination: Coordination normal.  Psychiatric:        Behavior: Behavior normal.      ED Treatments / Results  Labs (all labs ordered are listed, but only abnormal results are displayed) Labs Reviewed  WET PREP, GENITAL - Abnormal; Notable for the following components:      Result Value   Clue Cells Wet Prep HPF POC PRESENT (*)    WBC, Wet Prep HPF POC MANY (*)    All other components within normal limits  COMPREHENSIVE METABOLIC PANEL - Abnormal; Notable for the following components:    Glucose, Bld 103 (*)    Calcium 8.7 (*)    Alkaline Phosphatase 33 (*)    All other components within normal limits  CBC - Abnormal; Notable for the following components:   RBC 3.70 (*)    Hemoglobin 11.9 (*)    HCT 35.9 (*)    RDW 11.1 (*)    All other components within normal limits  LIPASE, BLOOD  URINALYSIS, ROUTINE W REFLEX MICROSCOPIC  I-STAT BETA HCG BLOOD, ED ([facility], [facility], [facility] ONLY)  GC/CHLAMYDIA PROBE AMP ([place]    EKG None  Radiology [place] Pelvic Complete W Transvaginal And Torsion R/o  Result Date: 10/28/2018 CLINICAL DATA:  18 year old female with pelvic pain x2 days. EXAM: TRANSABDOMINAL AND TRANSVAGINAL ULTRASOUND OF PELVIS DOPPLER ULTRASOUND OF OVARIES TECHNIQUE: Both transabdominal and transvaginal ultrasound examinations of the pelvis were performed. Transabdominal technique was performed for global imaging of the pelvis including uterus, ovaries, adnexal regions, and pelvic cul-de-sac. It was necessary to proceed with endovaginal exam following the transabdominal exam to visualize the endometrium and ovaries. Color and duplex Doppler ultrasound was utilized to evaluate blood flow to the ovaries. COMPARISON:  Pelvic ultrasound dated 09/20/2018 FINDINGS: Uterus Measurements: 6.5 x 3.3 x 3.3 cm = volume: 36 mL. No fibroids or other mass visualized. Endometrium Thickness: 9 mm.  No focal abnormality visualized. Right ovary Measurements: 2.5 x 1.7 x 1.7 cm = volume: 3.9 mL. Normal appearance/no adnexal mass. Left ovary Measurements: 2.4 x 1.8 x 2.0 cm = volume: 3.6 mL. Normal appearance/no adnexal mass. Pulsed Doppler evaluation of both ovaries demonstrates normal low-resistance arterial and venous waveforms. Other findings No abnormal free fluid. IMPRESSION: Unremarkable pelvic ultrasound. Electronically Signed   By: Elgie CollardArash  Radparvar M.D.   On: 10/28/2018 03:57    Procedures Procedures (including critical care time)  Medications Ordered in ED Medications - No data  to display   Initial Impression / Assessment and Plan / ED Course  I have reviewed the triage vital signs and the nursing notes.  Pertinent labs & imaging results that were available during my care of the patient were reviewed by me and considered in my medical decision making (see chart for details).        18 year old female presenting for 3 days of intermittent left lower quadrant pain.  Reports 3 episodes of vomiting this morning.  She has been asymptomatic for the duration of her ED visit.  Labs and imaging reassuring.  Question constipation versus mittelschmerz versus viral illness.  Will continue with outpatient tylenol and ibuprofen.  Return precautions discussed and provided. Patient discharged in stable condition with no unaddressed concerns.   Final Clinical Impressions(s) / ED Diagnoses   Final diagnoses:  Pelvic pain  in female    ED Discharge Orders    None       Antony MaduraHumes, Marceil Welp, Cordelia Poche-C 10/28/18 0503    Mesner, Barbara CowerJason, MD 10/28/18 [phone]

## 2018-10-28 NOTE — ED Notes (Signed)
Pelvic cart set up at bedside . Stirrups applied to bed .

## 2018-11-17 ENCOUNTER — Encounter: Payer: Self-pay | Admitting: General Practice

## 2018-11-28 ENCOUNTER — Other Ambulatory Visit: Payer: Self-pay

## 2018-11-28 ENCOUNTER — Encounter (HOSPITAL_COMMUNITY): Payer: Self-pay

## 2018-11-28 ENCOUNTER — Emergency Department (HOSPITAL_COMMUNITY)
Admission: EM | Admit: 2018-11-28 | Discharge: 2018-11-29 | Disposition: A | Discharge status: Home / Self Care | Payer: Medicaid Other | Attending: Emergency Medicine | Admitting: Emergency Medicine

## 2018-11-28 DIAGNOSIS — Z3A01 Less than 8 weeks gestation of pregnancy: Secondary | ICD-10-CM | POA: Diagnosis not present

## 2018-11-28 DIAGNOSIS — R51 Headache: Secondary | ICD-10-CM | POA: Diagnosis present

## 2018-11-28 DIAGNOSIS — R102 Pelvic and perineal pain: Secondary | ICD-10-CM | POA: Diagnosis not present

## 2018-11-28 DIAGNOSIS — Z79899 Other long term (current) drug therapy: Secondary | ICD-10-CM | POA: Diagnosis not present

## 2018-11-28 DIAGNOSIS — B9689 Other specified bacterial agents as the cause of diseases classified elsewhere: Secondary | ICD-10-CM | POA: Diagnosis not present

## 2018-11-28 DIAGNOSIS — O23591 Infection of other part of genital tract in pregnancy, first trimester: Secondary | ICD-10-CM | POA: Insufficient documentation

## 2018-11-28 DIAGNOSIS — O9989 Other specified diseases and conditions complicating pregnancy, childbirth and the puerperium: Secondary | ICD-10-CM | POA: Diagnosis not present

## 2018-11-28 LAB — COMPREHENSIVE METABOLIC PANEL
ALT: 13 U/L (ref 0–44)
AST: 17 U/L (ref 15–41)
Albumin: 3.9 g/dL (ref 3.5–5.0)
Alkaline Phosphatase: 30 U/L — ABNORMAL LOW (ref 38–126)
Anion gap: 9 (ref 5–15)
BUN: 11 mg/dL (ref 6–20)
CO2: 19 mmol/L — ABNORMAL LOW (ref 22–32)
Calcium: 9.1 mg/dL (ref 8.9–10.3)
Chloride: 106 mmol/L (ref 98–111)
Creatinine, Ser: 0.65 mg/dL (ref 0.44–1.00)
GFR calc Af Amer: >60 mL/min (ref >60)
GFR calc non Af Amer: >60 mL/min (ref >60)
Glucose, Bld: 102 mg/dL — ABNORMAL HIGH (ref 70–99)
Potassium: 3.9 mmol/L (ref 3.5–5.1)
Sodium: 134 mmol/L — ABNORMAL LOW (ref 135–145)
Total Bilirubin: 0.4 mg/dL (ref 0.3–1.2)
Total Protein: 6.5 g/dL (ref 6.5–8.1)

## 2018-11-28 LAB — CBC
HCT: 35.3 % — ABNORMAL LOW (ref 36.0–46.0)
Hemoglobin: 12 g/dL (ref 12.0–15.0)
MCH: 32.6 pg (ref 26.0–34.0)
MCHC: 34 g/dL (ref 30.0–36.0)
MCV: 95.9 fL (ref 80.0–100.0)
Platelets: 247 10*3/uL (ref 150–400)
RBC: 3.68 MIL/uL — ABNORMAL LOW (ref 3.87–5.11)
RDW: 11.1 % — ABNORMAL LOW (ref 11.5–15.5)
WBC: 6.5 10*3/uL (ref 4.0–10.5)
nRBC: 0 % (ref 0.0–0.2)

## 2018-11-28 LAB — URINALYSIS, ROUTINE W REFLEX MICROSCOPIC
Bilirubin Urine: NEGATIVE
Glucose, UA: NEGATIVE mg/dL
Hgb urine dipstick: NEGATIVE
Ketones, ur: NEGATIVE mg/dL
Leukocytes,Ua: NEGATIVE
Nitrite: NEGATIVE
Protein, ur: NEGATIVE mg/dL
Specific Gravity, Urine: 1.025 (ref 1.005–1.030)
pH: 6 (ref 5.0–8.0)

## 2018-11-28 LAB — LIPASE, BLOOD: Lipase: 30 U/L (ref 11–51)

## 2018-11-28 LAB — I-STAT BETA HCG BLOOD, ED (MC, WL, AP ONLY): I-stat hCG, quantitative: >2000 m[IU]/mL — ABNORMAL HIGH (ref <5)

## 2018-11-28 MED ORDER — SODIUM CHLORIDE 0.9% FLUSH: 3.0000 mL | Freq: Once | INTRAVENOUS | Status: DC

## 2018-11-28 NOTE — ED Notes (Signed)
Pt was seen leaving the waiting room and asked to stay. Pt did not want to stay as she stated that she was not in a lot of pain or discomfort.

## 2018-11-28 NOTE — ED Notes (Signed)
Pt called no answer 

## 2018-11-28 NOTE — ED Notes (Signed)
Called patient to reassess vitals x3 and had no answer. 

## 2018-11-28 NOTE — ED Triage Notes (Signed)
Pt states she has been having abdominal cramping and headache X1.5 weeks. She reports LMP was 10/06/18 but that is normal for her to have irregular periods. Pt denies nausea or vomiting or sick contacts.

## 2018-11-29 ENCOUNTER — Encounter (HOSPITAL_COMMUNITY): Payer: Self-pay

## 2018-11-29 ENCOUNTER — Emergency Department (HOSPITAL_COMMUNITY)
Admission: EM | Admit: 2018-11-29 | Discharge: 2018-11-29 | Disposition: A | Discharge status: Home / Self Care | Payer: Medicaid Other | Source: Home / Self Care

## 2018-11-29 ENCOUNTER — Emergency Department (HOSPITAL_COMMUNITY)
Admission: EM | Admit: 2018-11-29 | Discharge: 2018-11-29 | Disposition: A | Discharge status: Home / Self Care | Payer: Medicaid Other | Source: Home / Self Care | Attending: Emergency Medicine | Admitting: Emergency Medicine

## 2018-11-29 ENCOUNTER — Encounter (HOSPITAL_COMMUNITY): Payer: Self-pay | Admitting: Emergency Medicine

## 2018-11-29 ENCOUNTER — Other Ambulatory Visit: Payer: Self-pay

## 2018-11-29 ENCOUNTER — Emergency Department (HOSPITAL_COMMUNITY): Payer: Medicaid Other

## 2018-11-29 DIAGNOSIS — O9989 Other specified diseases and conditions complicating pregnancy, childbirth and the puerperium: Secondary | ICD-10-CM | POA: Insufficient documentation

## 2018-11-29 DIAGNOSIS — N760 Acute vaginitis: Secondary | ICD-10-CM

## 2018-11-29 DIAGNOSIS — B9689 Other specified bacterial agents as the cause of diseases classified elsewhere: Secondary | ICD-10-CM

## 2018-11-29 DIAGNOSIS — Z3201 Encounter for pregnancy test, result positive: Secondary | ICD-10-CM

## 2018-11-29 DIAGNOSIS — Z79899 Other long term (current) drug therapy: Secondary | ICD-10-CM | POA: Insufficient documentation

## 2018-11-29 DIAGNOSIS — Z3A01 Less than 8 weeks gestation of pregnancy: Secondary | ICD-10-CM | POA: Insufficient documentation

## 2018-11-29 DIAGNOSIS — Z5321 Procedure and treatment not carried out due to patient leaving prior to being seen by health care provider: Secondary | ICD-10-CM | POA: Insufficient documentation

## 2018-11-29 DIAGNOSIS — R51 Headache: Secondary | ICD-10-CM | POA: Insufficient documentation

## 2018-11-29 DIAGNOSIS — O23591 Infection of other part of genital tract in pregnancy, first trimester: Secondary | ICD-10-CM | POA: Insufficient documentation

## 2018-11-29 DIAGNOSIS — R102 Pelvic and perineal pain: Secondary | ICD-10-CM | POA: Insufficient documentation

## 2018-11-29 LAB — COMPREHENSIVE METABOLIC PANEL
ALT: 19 U/L (ref 0–44)
AST: 19 U/L (ref 15–41)
Albumin: 4.3 g/dL (ref 3.5–5.0)
Alkaline Phosphatase: 32 U/L — ABNORMAL LOW (ref 38–126)
Anion gap: 8 (ref 5–15)
BUN: 11 mg/dL (ref 6–20)
CO2: 22 mmol/L (ref 22–32)
Calcium: 9 mg/dL (ref 8.9–10.3)
Chloride: 105 mmol/L (ref 98–111)
Creatinine, Ser: 0.57 mg/dL (ref 0.44–1.00)
GFR calc Af Amer: >60 mL/min (ref >60)
GFR calc non Af Amer: >60 mL/min (ref >60)
Glucose, Bld: 89 mg/dL (ref 70–99)
Potassium: 3.8 mmol/L (ref 3.5–5.1)
Sodium: 135 mmol/L (ref 135–145)
Total Bilirubin: 0.5 mg/dL (ref 0.3–1.2)
Total Protein: 7.6 g/dL (ref 6.5–8.1)

## 2018-11-29 LAB — URINALYSIS, ROUTINE W REFLEX MICROSCOPIC
Bilirubin Urine: NEGATIVE
Glucose, UA: NEGATIVE mg/dL
Hgb urine dipstick: NEGATIVE
Ketones, ur: NEGATIVE mg/dL
Nitrite: NEGATIVE
Protein, ur: NEGATIVE mg/dL
Specific Gravity, Urine: 1.014 (ref 1.005–1.030)
pH: 7 (ref 5.0–8.0)

## 2018-11-29 LAB — WET PREP, GENITAL
Sperm: NONE SEEN
Trich, Wet Prep: NONE SEEN
Yeast Wet Prep HPF POC: NONE SEEN

## 2018-11-29 LAB — CBC
HCT: 37.3 % (ref 36.0–46.0)
Hemoglobin: 12.5 g/dL (ref 12.0–15.0)
MCH: 32.5 pg (ref 26.0–34.0)
MCHC: 33.5 g/dL (ref 30.0–36.0)
MCV: 96.9 fL (ref 80.0–100.0)
Platelets: 231 10*3/uL (ref 150–400)
RBC: 3.85 MIL/uL — ABNORMAL LOW (ref 3.87–5.11)
RDW: 11.1 % — ABNORMAL LOW (ref 11.5–15.5)
WBC: 7.1 10*3/uL (ref 4.0–10.5)
nRBC: 0 % (ref 0.0–0.2)

## 2018-11-29 LAB — LIPASE, BLOOD: Lipase: 30 U/L (ref 11–51)

## 2018-11-29 LAB — I-STAT BETA HCG BLOOD, ED (MC, WL, AP ONLY): I-stat hCG, quantitative: >2000 m[IU]/mL — ABNORMAL HIGH (ref <5)

## 2018-11-29 LAB — HCG, QUANTITATIVE, PREGNANCY: hCG, Beta Chain, Quant, S: 18385 m[IU]/mL — ABNORMAL HIGH (ref <5)

## 2018-11-29 MED ORDER — METRONIDAZOLE 500 MG PO TABS: 500.0000 mg | ORAL_TABLET | Freq: Two times a day (BID) | ORAL | 0 refills | Status: DC

## 2018-11-29 MED ORDER — SODIUM CHLORIDE 0.9% FLUSH: 3.0000 mL | Freq: Once | INTRAVENOUS | Status: DC

## 2018-11-29 NOTE — ED Notes (Signed)
Patient ambulated to restroom for UA sample

## 2018-11-29 NOTE — ED Triage Notes (Addendum)
Patient c/o headaches and abdominal cramps that started 1.5 weeks ago.    4/10 cramping pain in abdomen 6/10 headache achy pain frontal lobe area    Denies N/V or diarrhea    A/Ox4 Ambulatory in triage.    LMP- June 4th   Patient states there is a chance she could be pregnant.

## 2018-11-29 NOTE — ED Provider Notes (Signed)
[facility]-EMERGENCY DEPT Provider Note   CSN: [id number] Arrival date & time: 11/29/18  1114     History   Chief Complaint Chief Complaint  Patient presents with   Abdominal Pain   Headache    HPI Judith Evans is a 18 y.o. female without significant past medical hx who presents to the ED w/ complaints of intermittent abdominal pain & headache x 1.5 weeks.   Patient reports intermittent L sided pelvic pain that occurs a few times per day and lasts a few seconds per episodes w/o alleviating/aggravating factors. No intervention PTA. Has had intermittent vaginal discharge that is not necessarily atypical for her. Denies fever, chills, N/V/D, vaginal bleeding, or dysuria. LMP 10/06/18. 1 sexual partner in past 6 months, not concerned for STDs.   Patient reports intermittent L frontal headache- occurs with sneezing/coughing, but otherwise no significant pain. Variable durations. No sudden onset. Hx of similar headaches. Denies change in vision, numbness, tingling, weakness, head injury, or vomiting.      HPI  History reviewed. No pertinent past medical history.  There are no active problems to display for this patient.   History reviewed. No pertinent surgical history.   OB History   No obstetric history on file.      Home Medications    Prior to Admission medications   Medication Sig Start Date End Date Taking? Authorizing Provider  CRYSELLE-28 0.3-30 MG-MCG tablet Take 1 tablet by mouth daily. 09/15/18   [provider]    Family History No family history on file.  Social History Social History   Tobacco Use   Smoking status: Never Smoker   Smokeless tobacco: Never Used  Substance Use Topics   Alcohol use: Never    Frequency: Never   Drug use: Never     Allergies   Patient has no known allergies.   Review of Systems Review of Systems  Constitutional: Negative for chills and fever.  HENT: Negative for congestion,  ear pain and sore throat.   Eyes: Negative for visual disturbance.  Respiratory: Negative for shortness of breath.   Cardiovascular: Negative for chest pain.  Gastrointestinal: Positive for abdominal pain. Negative for blood in stool, constipation, diarrhea, nausea and vomiting.  Genitourinary: Positive for vaginal discharge. Negative for dysuria and vaginal bleeding.  Neurological: Positive for headaches. Negative for dizziness, tremors, seizures, syncope, speech difficulty, weakness, light-headedness and numbness.  All other systems reviewed and are negative.    Physical Exam Updated Vital Signs BP 105/71 (BP Location: Left Arm)    Pulse 80    Temp 98.7 F (37.1 C) (Oral)    Resp 15    LMP 11/02/2018    SpO2 100%   Physical Exam Vitals signs and nursing note reviewed. Exam conducted with a chaperone present.  Constitutional:      General: She is not in acute distress.    Appearance: She is well-developed.  HENT:     Head: Normocephalic and atraumatic.     Right Ear: Ear canal normal. Tympanic membrane is not perforated, erythematous, retracted or bulging.     Left Ear: Ear canal normal. Tympanic membrane is not perforated, erythematous, retracted or bulging.     Ears:     Comments: No mastoid erythema/swelling/tenderness.     Nose:     Right Sinus: No maxillary sinus tenderness or frontal sinus tenderness.     Left Sinus: No maxillary sinus tenderness or frontal sinus tenderness.     Mouth/Throat:     Pharynx:  Uvula midline. No oropharyngeal exudate or posterior oropharyngeal erythema.     Comments: Posterior oropharynx is symmetric appearing. Patient tolerating own secretions without difficulty. No trismus. No drooling. No hot potato voice. No swelling beneath the tongue, submandibular compartment is soft.  Eyes:     General:        Right eye: No discharge.        Left eye: No discharge.     Extraocular Movements: Extraocular movements intact.     Conjunctiva/sclera:  Conjunctivae normal.     Pupils: Pupils are equal, round, and reactive to light.     Comments: No proptosis.   Neck:     Musculoskeletal: Normal range of motion and neck supple. No edema or neck rigidity.  Cardiovascular:     Rate and Rhythm: Normal rate and regular rhythm.     Heart sounds: No murmur.  Pulmonary:     Effort: Pulmonary effort is normal. No respiratory distress.     Breath sounds: Normal breath sounds. No wheezing, rhonchi or rales.  Abdominal:     General: There is no distension.     Palpations: Abdomen is soft.     Tenderness: There is no abdominal tenderness. There is no right CVA tenderness, left CVA tenderness, guarding or rebound. Negative signs include Murphy's sign and McBurney's sign.  Genitourinary:    Exam position: Supine.     Labia:        Right: No lesion.        Left: No lesion.      Vagina: Vaginal discharge (mild amount of white discharge) present. No bleeding.     Cervix: No cervical motion tenderness or friability.     Adnexa:        Right: No mass, tenderness or fullness.         Left: No mass, tenderness or fullness.       Comments: EDT Apolonio Schneiders present as chaperone.  Lymphadenopathy:     Cervical: No cervical adenopathy.  Skin:    General: Skin is warm and dry.     Findings: No rash.  Neurological:     Mental Status: She is alert.     Comments: Alert. Clear speech. No facial droop. CNIII-XII grossly intact. Bilateral upper and lower extremities' sensation grossly intact. 5/5 symmetric strength with grip strength and with plantar and dorsi flexion bilaterally. Normal finger to nose bilaterally. Negative pronator drift. Negative Romberg sign. Gait is steady and intact.   Psychiatric:        Behavior: Behavior normal.    ED Treatments / Results  Labs (all labs ordered are listed, but only abnormal results are displayed) Labs Reviewed  WET PREP, GENITAL - Abnormal; Notable for the following components:      Result Value   Clue Cells Wet Prep  HPF POC PRESENT (*)    WBC, Wet Prep HPF POC MODERATE (*)    All other components within normal limits  COMPREHENSIVE METABOLIC PANEL - Abnormal; Notable for the following components:   Alkaline Phosphatase 32 (*)    All other components within normal limits  CBC - Abnormal; Notable for the following components:   RBC 3.85 (*)    RDW 11.1 (*)    All other components within normal limits  URINALYSIS, ROUTINE W REFLEX MICROSCOPIC - Abnormal; Notable for the following components:   APPearance HAZY (*)    Leukocytes,Ua TRACE (*)    Bacteria, UA RARE (*)    All other components within normal limits  HCG, QUANTITATIVE, PREGNANCY - Abnormal; Notable for the following components:   hCG, Beta Chain, Quant, S 18,385 (*)    All other components within normal limits  I-STAT BETA HCG BLOOD, ED ([facility], [facility], [facility] ONLY) - Abnormal; Notable for the following components:   I-stat hCG, quantitative >2,000.0 (*)    All other components within normal limits  URINE CULTURE  LIPASE, BLOOD  RPR  HIV ANTIBODY (ROUTINE TESTING W REFLEX)  GC/CHLAMYDIA PROBE AMP ([place]    EKG None  Radiology [place] Ob Comp < 14 Wks  Result Date: 11/29/2018 CLINICAL DATA:  Pelvic pain in early pregnancy for 1.5 weeks EXAM: OBSTETRIC <14 WK US AND TRANSVAGINAL OB US TECHNIQUE: Both transabdominal and transvaginal ultrasound examinations were performed for complete evaluation of the gestation as well as the maternal uterus, adnexal regions, and pelvic cul-de-sac. Transvaginal technique was performed to assess early pregnancy. COMPARISON:  None for this gestation FINDINGS: Intrauterine gestational sac: Present, single Yolk sac:  Present Embryo:  Not definitely visualized Cardiac Activity: N/A Heart Rate: N/A  bpm MSD: 0.94 mm   5 w   5 d Subchorionic hemorrhage:  None identified Maternal uterus/adnexae: LEFT ovary 2.8 x 3.4 x 2.3 cm, containing a corpus luteal cyst. RIGHT ovary normal size and morphology, 1.4 x 3.5 x 1.1  cm. Trace free pelvic fluid. No additional adnexal masses. IMPRESSION: Gestational sac containing a yolk sac identified within the uterus. No fetal pole identified to establish viability. Corpus luteal cyst LEFT ovary. Electronically Signed   By: Ulyses SouthwardMark  Boles M.D.   On: 11/29/2018 14:03   [place] Ob Transvaginal  Result Date: 11/29/2018 CLINICAL DATA:  Pelvic pain in early pregnancy for 1.5 weeks EXAM: OBSTETRIC <14 WK US AND TRANSVAGINAL OB US TECHNIQUE: Both transabdominal and transvaginal ultrasound examinations were performed for complete evaluation of the gestation as well as the maternal uterus, adnexal regions, and pelvic cul-de-sac. Transvaginal technique was performed to assess early pregnancy. COMPARISON:  None for this gestation FINDINGS: Intrauterine gestational sac: Present, single Yolk sac:  Present Embryo:  Not definitely visualized Cardiac Activity: N/A Heart Rate: N/A  bpm MSD: 0.94 mm   5 w   5 d Subchorionic hemorrhage:  None identified Maternal uterus/adnexae: LEFT ovary 2.8 x 3.4 x 2.3 cm, containing a corpus luteal cyst. RIGHT ovary normal size and morphology, 1.4 x 3.5 x 1.1 cm. Trace free pelvic fluid. No additional adnexal masses. IMPRESSION: Gestational sac containing a yolk sac identified within the uterus. No fetal pole identified to establish viability. Corpus luteal cyst LEFT ovary. Electronically Signed   By: Ulyses SouthwardMark  Boles M.D.   On: 11/29/2018 14:03    Procedures Procedures (including critical care time)  Medications Ordered in ED Medications - No data to display   Initial Impression / Assessment and Plan / ED Course  I have reviewed the triage vital signs and the nursing notes.  Pertinent labs & imaging results that were available during my care of the patient were reviewed by me and considered in my medical decision making (see chart for details).   Patient presents to the ED w/ complaints of intermittent abdominal pain & intermittent headaches x 1.5 weeks. Nontoxic  appearing, no apparent distress, vitals WNL. Exam w/o neuro deficits. No abdominal, adnexal, or cervical motion tenderness- no peritoneal signs.   CBC: No leukocytosis or significant anemia CMP: No significant electrolyte derangement.  Renal function and liver function preserved Lipase: Within normal limits. Pregnancy test: Positive Urinalysis: Rare bacteria, trace leuks,  however 21-50 squamous epithelial cells, will culture, however does not seem consistent with asymptomatic bacteriuria requiring treatment in pregnancy given contamination at this time- would tx based off culture.  Wet prep: Consistent with BV, will start Flagyl. Quantitative hCG: 18,385 US: Gestational sac containing a yolk sac identified within the uterus. No fetal pole identified to establish viability. Corpus luteal cyst LEFT ovary  Regarding her intermittent pelvic pain, this may be pregnancy related or may be due to her corpus luteal cyst, I have a low suspicion for ovarian torsion based on her history and exam.  No fetal pole identified, however no obvious ectopic, will need repeat ultrasound and repeat quantitative hCG.  Start prenatals.  No adnexal or cervical motion tenderness on exam to suggest PID, STD cultures pending.  Will treat BV with Flagyl.  Regarding her headache- has hx of similar headaches, no thunderclap sensation,  pt is afebrile with no focal neuro deficits, dizziness, change in vision, proptosis, or nuchal rigidity- non concerning for [facility], ICH, ischemic CVA, dural venous sinus thrombosis, acute glaucoma, giant cell arteritis, mass, or meningitis.  Recommended Tylenol for headache/abdominal discomfort given patient is pregnant.  I discussed results, treatment plan, need for follow-up, and return precautions with the patient. Provided opportunity for questions, patient confirmed understanding and is in agreement with plan.   Findings and plan of care discussed with supervising physician Dr. Patria Maneampos who is in  agreement.   Final Clinical Impressions(s) / ED Diagnoses   Final diagnoses:  Positive pregnancy test  Bacterial vaginosis    ED Discharge Orders         Ordered    metroNIDAZOLE (FLAGYL) 500 MG tablet  2 times daily     11/29/18 [address]1512           Trinetta Alemu, Pleas KochSamantha R, PA-C 11/29/18 1518    Azalia Bilisampos, Kevin, MD 11/30/18 [phone]

## 2018-11-29 NOTE — Discharge Instructions (Signed)
You were seen in the emergency department today for pelvic pain and a headache.  Your results show that you are currently pregnant, your ultrasound was not overly conclusive, for this reason we would like you to have a repeat ultrasound and a repeat quantitative hCG, call women's health for follow-up appointment within 3 days to have discussion regarding repeat of these test.  Your wet prep showed bacterial vaginosis, we are treating this with Flagyl, an antibiotic, please take this as prescribed.  Do not drink alcohol with this medicine as it is extremely dangerous, you may also drink alcohol in pregnancy safely either.  We have prescribed you new medication(s) today. Discuss the medications prescribed today with your pharmacist as they can have adverse effects and interactions with your other medicines including over the counter and prescribed medications. Seek medical evaluation if you start to experience new or abnormal symptoms after taking one of these medicines, seek care immediately if you start to experience difficulty breathing, feeling of your throat closing, facial swelling, or rash as these could be indications of a more serious allergic reaction  You were tested you for gonorrhea, chlamydia, HIV, and syphilis, if positive we will call you with results, if positive you will need to inform all sexual partners.  Please start taking a prenatal vitamin.  Please take Flagyl as prescribed.  Please follow-up with women's health as discussed above.  Return to the ER for new or worsening symptoms including but not limited to worsening pain, constant pain, inability to keep fluids down, fever, or any other concerns.

## 2018-11-29 NOTE — ED Notes (Signed)
Urine Culture sent to Lab.

## 2018-11-29 NOTE — ED Triage Notes (Signed)
Pt reports lower abd pain and headache x1.5 weeks, states she was here yesterday but left before being seen due to wait time. Denies n/v/d/ fever, chills

## 2018-11-30 LAB — HIV ANTIBODY (ROUTINE TESTING W REFLEX): HIV Screen 4th Generation wRfx: NONREACTIVE

## 2018-11-30 LAB — URINE CULTURE: Culture: <10000 — AB

## 2018-11-30 LAB — GC/CHLAMYDIA PROBE AMP (~~LOC~~) NOT AT ARMC
Chlamydia: NEGATIVE
Neisseria Gonorrhea: NEGATIVE

## 2018-11-30 LAB — RPR: RPR Ser Ql: NONREACTIVE

## 2018-12-12 ENCOUNTER — Ambulatory Visit (INDEPENDENT_AMBULATORY_CARE_PROVIDER_SITE_OTHER): Payer: Medicaid Other | Admitting: *Deleted

## 2018-12-12 ENCOUNTER — Other Ambulatory Visit: Payer: Self-pay

## 2018-12-12 DIAGNOSIS — Z3400 Encounter for supervision of normal first pregnancy, unspecified trimester: Secondary | ICD-10-CM

## 2018-12-12 NOTE — Patient Instructions (Addendum)
First Trimester of Pregnancy ° °The first trimester of pregnancy is from week 1 until the end of week 13 (months 1 through 3). During this time, your baby will begin to develop inside you. At 6-8 weeks, the eyes and face are formed, and the heartbeat can be seen on ultrasound. At the end of 12 weeks, all the baby's organs are formed. Prenatal care is all the medical care you receive before the birth of your baby. Make sure you get good prenatal care and follow all of your doctor's instructions. °Follow these instructions at home: °Medicines °· Take over-the-counter and prescription medicines only as told by your doctor. Some medicines are safe and some medicines are not safe during pregnancy. °· Take a prenatal vitamin that contains at least 600 micrograms (mcg) of folic acid. °· If you have trouble pooping (constipation), take medicine that will make your stool soft (stool softener) if your doctor approves. °Eating and drinking ° °· Eat regular, healthy meals. °· Your doctor will tell you the amount of weight gain that is right for you. °· Avoid raw meat and uncooked cheese. °· If you feel sick to your stomach (nauseous) or throw up (vomit): °? Eat 4 or 5 small meals a day instead of 3 large meals. °? Try eating a few soda crackers. °? Drink liquids between meals instead of during meals. °· To prevent constipation: °? Eat foods that are high in fiber, like fresh fruits and vegetables, whole grains, and beans. °? Drink enough fluids to keep your pee (urine) clear or pale yellow. °Activity °· Exercise only as told by your doctor. Stop exercising if you have cramps or pain in your lower belly (abdomen) or low back. °· Do not exercise if it is too hot, too humid, or if you are in a place of great height (high altitude). °· Try to avoid standing for long periods of time. Move your legs often if you must stand in one place for a long time. °· Avoid heavy lifting. °· Wear low-heeled shoes. Sit and stand up  straight. °· You can have sex unless your doctor tells you not to. °Relieving pain and discomfort °· Wear a good support bra if your breasts are sore. °· Take warm water baths (sitz baths) to soothe pain or discomfort caused by hemorrhoids. Use hemorrhoid cream if your doctor says it is okay. °· Rest with your legs raised if you have leg cramps or low back pain. °· If you have puffy, bulging veins (varicose veins) in your legs: °? Wear support hose or compression stockings as told by your doctor. °? Raise (elevate) your feet for 15 minutes, 3-4 times a day. °? Limit salt in your food. °Prenatal care °· Schedule your prenatal visits by the twelfth week of pregnancy. °· Write down your questions. Take them to your prenatal visits. °· Keep all your prenatal visits as told by your doctor. This is important. °Safety °· Wear your seat belt at all times when driving. °· Make a list of emergency phone numbers. The list should include numbers for family, friends, the hospital, and police and fire departments. °General instructions °· Ask your doctor for a referral to a local prenatal class. Begin classes no later than at the start of month 6 of your pregnancy. °· Ask for help if you need counseling or if you need help with nutrition. Your doctor can give you advice or tell you where to go for help. °· Do not use hot tubs, steam   rooms, or saunas. °· Do not douche or use tampons or scented sanitary pads. °· Do not cross your legs for long periods of time. °· Avoid all herbs and alcohol. Avoid drugs that are not approved by your doctor. °· Do not use any tobacco products, including cigarettes, chewing tobacco, and electronic cigarettes. If you need help quitting, ask your doctor. You may get counseling or other support to help you quit. °· Avoid cat litter boxes and soil used by cats. These carry germs that can cause birth defects in the baby and can cause a loss of your baby (miscarriage) or stillbirth. °· Visit your dentist.  At home, brush your teeth with a soft toothbrush. Be gentle when you floss. °Contact a doctor if: °· You are dizzy. °· You have mild cramps or pressure in your lower belly. °· You have a nagging pain in your belly area. °· You continue to feel sick to your stomach, you throw up, or you have watery poop (diarrhea). °· You have a bad smelling fluid coming from your vagina. °· You have pain when you pee (urinate). °· You have increased puffiness (swelling) in your face, hands, legs, or ankles. °Get help right away if: °· You have a fever. °· You are leaking fluid from your vagina. °· You have spotting or bleeding from your vagina. °· You have very bad belly cramping or pain. °· You gain or lose weight rapidly. °· You throw up blood. It may look like coffee grounds. °· You are around people who have German measles, fifth disease, or chickenpox. °· You have a very bad headache. °· You have shortness of breath. °· You have any kind of trauma, such as from a fall or a car accident. °Summary °· The first trimester of pregnancy is from week 1 until the end of week 13 (months 1 through 3). °· To take care of yourself and your unborn baby, you will need to eat healthy meals, take medicines only if your doctor tells you to do so, and do activities that are safe for you and your baby. °· Keep all follow-up visits as told by your doctor. This is important as your doctor will have to ensure that your baby is healthy and growing well. °This information is not intended to replace advice given to you by your health care provider. Make sure you discuss any questions you have with your health care provider. °Document Released: 10/07/2007 Document Revised: 08/11/2018 Document Reviewed: 04/28/2016 °Elsevier Patient Education © 2020 [employer] ° °

## 2018-12-12 NOTE — Progress Notes (Signed)
   Virtual Visit via Telephone Note  I connected with Judith Evans on 12/12/18 at 10:10 AM EDT by telephone and verified that I am speaking with the correct person using two identifiers.  Location: Patient: Judith Evans Provider: Derl Barrow, RN   I discussed the limitations, risks, security and privacy concerns of performing an evaluation and management service by telephone and the availability of in person appointments. I also discussed with the patient that there may be a patient responsible charge related to this service. The patient expressed understanding and agreed to proceed.   History of Present Illness: PRENATAL INTAKE SUMMARY  Judith Evans presents today New OB Nurse Interview.  OB History    Gravida  2   Para  0   Term      Preterm      AB  1   Living  0     SAB  1   TAB      Ectopic      Multiple      Live Births  0          I have reviewed the patient's medical, obstetrical, social, and family histories, medications, and available lab results.  SUBJECTIVE She has no unusual complaints   Observations/Objective: Initial nurse interview for history/labs (New OB)  EDD: 07/28/2019 GA: [redacted]w[redacted]d G2P0010 FHT: non face to face visit  GENERAL APPEARANCE: non face to face interview  Assessment and Plan: Normal pregnancy Prenatal care-[facility] Lab work and physical to be completed at new ob with midwife   Follow Up Instructions:   I discussed the assessment and treatment plan with the patient. The patient was provided an opportunity to ask questions and all were answered. The patient agreed with the plan and demonstrated an understanding of the instructions.   The patient was advised to call back or seek an in-person evaluation if the symptoms worsen or if the condition fails to improve as anticipated.  I provided 10 minutes of non-face-to-face time during this encounter.   Derl Barrow, RN

## 2018-12-17 ENCOUNTER — Encounter (HOSPITAL_COMMUNITY): Payer: Self-pay

## 2018-12-17 ENCOUNTER — Other Ambulatory Visit: Payer: Self-pay

## 2018-12-17 ENCOUNTER — Emergency Department (HOSPITAL_COMMUNITY)
Admission: EM | Admit: 2018-12-17 | Discharge: 2018-12-17 | Disposition: A | Discharge status: Home / Self Care | Payer: Medicaid Other | Attending: Emergency Medicine | Admitting: Emergency Medicine

## 2018-12-17 DIAGNOSIS — R519 Headache, unspecified: Secondary | ICD-10-CM

## 2018-12-17 DIAGNOSIS — O99281 Endocrine, nutritional and metabolic diseases complicating pregnancy, first trimester: Secondary | ICD-10-CM | POA: Diagnosis not present

## 2018-12-17 DIAGNOSIS — O9989 Other specified diseases and conditions complicating pregnancy, childbirth and the puerperium: Secondary | ICD-10-CM | POA: Diagnosis not present

## 2018-12-17 DIAGNOSIS — R51 Headache: Secondary | ICD-10-CM | POA: Diagnosis not present

## 2018-12-17 DIAGNOSIS — Z3A08 8 weeks gestation of pregnancy: Secondary | ICD-10-CM

## 2018-12-17 DIAGNOSIS — E860 Dehydration: Secondary | ICD-10-CM | POA: Diagnosis not present

## 2018-12-17 LAB — CBC WITH DIFFERENTIAL/PLATELET
Abs Immature Granulocytes: 0.03 10*3/uL (ref 0.00–0.07)
Basophils Absolute: 0 10*3/uL (ref 0.0–0.1)
Basophils Relative: 0 %
Eosinophils Absolute: 0.1 10*3/uL (ref 0.0–0.5)
Eosinophils Relative: 1 %
HCT: 36.2 % (ref 36.0–46.0)
Hemoglobin: 12 g/dL (ref 12.0–15.0)
Immature Granulocytes: 0 %
Lymphocytes Relative: 32 %
Lymphs Abs: 2.8 10*3/uL (ref 0.7–4.0)
MCH: 32.3 pg (ref 26.0–34.0)
MCHC: 33.1 g/dL (ref 30.0–36.0)
MCV: 97.6 fL (ref 80.0–100.0)
Monocytes Absolute: 0.8 10*3/uL (ref 0.1–1.0)
Monocytes Relative: 9 %
Neutro Abs: 5.2 10*3/uL (ref 1.7–7.7)
Neutrophils Relative %: 58 %
Platelets: 235 10*3/uL (ref 150–400)
RBC: 3.71 MIL/uL — ABNORMAL LOW (ref 3.87–5.11)
RDW: 11.3 % — ABNORMAL LOW (ref 11.5–15.5)
WBC: 8.9 10*3/uL (ref 4.0–10.5)
nRBC: 0 % (ref 0.0–0.2)

## 2018-12-17 LAB — COMPREHENSIVE METABOLIC PANEL
ALT: 16 U/L (ref 0–44)
AST: 16 U/L (ref 15–41)
Albumin: 4.1 g/dL (ref 3.5–5.0)
Alkaline Phosphatase: 28 U/L — ABNORMAL LOW (ref 38–126)
Anion gap: 7 (ref 5–15)
BUN: 9 mg/dL (ref 6–20)
CO2: 21 mmol/L — ABNORMAL LOW (ref 22–32)
Calcium: 9 mg/dL (ref 8.9–10.3)
Chloride: 107 mmol/L (ref 98–111)
Creatinine, Ser: 0.65 mg/dL (ref 0.44–1.00)
GFR calc Af Amer: >60 mL/min (ref >60)
GFR calc non Af Amer: >60 mL/min (ref >60)
Glucose, Bld: 88 mg/dL (ref 70–99)
Potassium: 3.6 mmol/L (ref 3.5–5.1)
Sodium: 135 mmol/L (ref 135–145)
Total Bilirubin: 0.6 mg/dL (ref 0.3–1.2)
Total Protein: 7.3 g/dL (ref 6.5–8.1)

## 2018-12-17 LAB — URINALYSIS, ROUTINE W REFLEX MICROSCOPIC
Bilirubin Urine: NEGATIVE
Glucose, UA: NEGATIVE mg/dL
Hgb urine dipstick: NEGATIVE
Ketones, ur: 5 mg/dL — AB
Leukocytes,Ua: NEGATIVE
Nitrite: NEGATIVE
Protein, ur: NEGATIVE mg/dL
Specific Gravity, Urine: 1.027 (ref 1.005–1.030)
pH: 6 (ref 5.0–8.0)

## 2018-12-17 MED ORDER — SODIUM CHLORIDE 0.9 % IV BOLUS
1000.0000 mL | Freq: Once | INTRAVENOUS | Status: AC
  Administered 2018-12-17: 09:00:00 1000 mL via INTRAVENOUS

## 2018-12-17 MED ORDER — ACETAMINOPHEN 500 MG PO TABS
1000.0000 mg | ORAL_TABLET | Freq: Once | ORAL | Status: AC
  Administered 2018-12-17: 09:00:00 1000 mg via ORAL
  Filled 2018-12-17: qty 2

## 2018-12-17 MED ORDER — ONDANSETRON HCL 4 MG/2ML IJ SOLN
4.0000 mg | Freq: Once | INTRAMUSCULAR | Status: AC
  Administered 2018-12-17: 4 mg via INTRAVENOUS
  Filled 2018-12-17: qty 2

## 2018-12-17 NOTE — ED Triage Notes (Signed)
Pt c/o headaches since she left last week. Pt states she is due March 25- currently [redacted] weeks pregnant. Pt's first OBGYN appt is Tuesday.

## 2018-12-17 NOTE — ED Provider Notes (Signed)
[facility]-EMERGENCY DEPT Provider Note   CSN: [id number] Arrival date & time: 12/17/18  0730    History   Chief Complaint Chief Complaint  Patient presents with  . Headache    HPI Judith Evans is a 18 y.o. female.     Pt presents to the ED today with a headache.  She has had it for several days.  She was seen here on 7/28 for the same.  There, she was diagnosed with pregnancy.  She had an US which did show an IUP.  She is now 8 weeks.  She was supposed to see obgyn last week, but it was rescheduled for this Tuesday (August 18).  Pt said she has been very nauseous and has not been able to drink or eat much.     Past Medical History:  Diagnosis Date  . Medical history non-contributory     Patient Active Problem List   Diagnosis Date Noted  . Supervision of normal first pregnancy, antepartum 12/12/2018    Past Surgical History:  Procedure Laterality Date  . NO PAST SURGERIES       OB History    Gravida  2   Para  0   Term      Preterm      AB  1   Living  0     SAB  1   TAB      Ectopic      Multiple      Live Births  0            Home Medications    Prior to Admission medications   Not on File    Family History No family history on file.  Social History Social History   Tobacco Use  . Smoking status: Never Smoker  . Smokeless tobacco: Never Used  Substance Use Topics  . Alcohol use: Never    Frequency: Never  . Drug use: Never     Allergies   Patient has no known allergies.   Review of Systems Review of Systems  Neurological: Positive for headaches.  All other systems reviewed and are negative.    Physical Exam Updated Vital Signs BP 108/81 (BP Location: Left Arm)   Pulse 78   Temp 98.9 F (37.2 C) (Oral)   Resp 14   Ht 5' (1.524 m)   Wt 48.6 kg   LMP 10/21/2018 (Approximate)   SpO2 99%   BMI 20.93 kg/m   Physical Exam Vitals signs and nursing note reviewed.  Constitutional:       Appearance: She is well-developed.  HENT:     Head: Normocephalic and atraumatic.     Mouth/Throat:     Mouth: Mucous membranes are moist.     Pharynx: Oropharynx is clear.  Eyes:     Extraocular Movements: Extraocular movements intact.     Pupils: Pupils are equal, round, and reactive to light.  Neck:     Musculoskeletal: Normal range of motion and neck supple.  Cardiovascular:     Rate and Rhythm: Normal rate and regular rhythm.     Heart sounds: Normal heart sounds.  Pulmonary:     Effort: Pulmonary effort is normal.     Breath sounds: Normal breath sounds.  Abdominal:     General: Bowel sounds are normal.     Palpations: Abdomen is soft.  Musculoskeletal: Normal range of motion.  Skin:    General: Skin is warm.     Capillary Refill: Capillary refill  takes less than 2 seconds.  Neurological:     Mental Status: She is alert and oriented to person, place, and time.  Psychiatric:        Mood and Affect: Mood normal.        Speech: Speech normal.        Behavior: Behavior normal.      ED Treatments / Results  Labs (all labs ordered are listed, but only abnormal results are displayed) Labs Reviewed  CBC WITH DIFFERENTIAL/PLATELET - Abnormal; Notable for the following components:      Result Value   RBC 3.71 (*)    RDW 11.3 (*)    All other components within normal limits  COMPREHENSIVE METABOLIC PANEL - Abnormal; Notable for the following components:   CO2 21 (*)    Alkaline Phosphatase 28 (*)    All other components within normal limits  URINALYSIS, ROUTINE W REFLEX MICROSCOPIC - Abnormal; Notable for the following components:   APPearance CLOUDY (*)    Ketones, ur 5 (*)    All other components within normal limits    EKG None  Radiology No results found.  Procedures Procedures (including critical care time)  Medications Ordered in ED Medications  sodium chloride 0.9 % bolus 1,000 mL (1,000 mLs Intravenous New Bag/Given 12/17/18 0850)  ondansetron  (ZOFRAN) injection 4 mg (4 mg Intravenous Given 12/17/18 0850)  acetaminophen (TYLENOL) tablet 1,000 mg (1,000 mg Oral Given 12/17/18 0850)     Initial Impression / Assessment and Plan / ED Course  I have reviewed the triage vital signs and the nursing notes.  Pertinent labs & imaging results that were available during my care of the patient were reviewed by me and considered in my medical decision making (see chart for details).     Pt is feeling much better.  She is tolerating po fluids.  She knows to return if worse.  F/u with obgyn.  Final Clinical Impressions(s) / ED Diagnoses   Final diagnoses:  Acute nonintractable headache, unspecified headache type  Dehydration  [redacted] weeks gestation of pregnancy    ED Discharge Orders    None       Isla Pence, MD 12/17/18 1012

## 2018-12-17 NOTE — Discharge Instructions (Signed)
Drink lots of fluids

## 2018-12-21 ENCOUNTER — Encounter: Payer: Self-pay | Admitting: General Practice

## 2018-12-26 NOTE — Telephone Encounter (Signed)
Unable to leave voice message. Trying to reach patient regarding appointment change and in regards to visit from ED. Patient has not read her Mychart messages from 8/17 and 8/19.  Derl Barrow, RN

## 2018-12-29 ENCOUNTER — Encounter: Payer: Medicaid Other | Admitting: Advanced Practice Midwife

## 2018-12-29 ENCOUNTER — Encounter: Payer: Self-pay | Admitting: General Practice

## 2018-12-30 ENCOUNTER — Encounter: Payer: Medicaid Other | Admitting: Obstetrics and Gynecology

## 2018-12-31 ENCOUNTER — Other Ambulatory Visit: Payer: Self-pay

## 2018-12-31 ENCOUNTER — Inpatient Hospital Stay (EMERGENCY_DEPARTMENT_HOSPITAL)
Admission: EM | Admit: 2018-12-31 | Discharge: 2019-01-01 | Disposition: A | Discharge status: Home / Self Care | Payer: Medicaid Other | Source: Home / Self Care | Admitting: Obstetrics and Gynecology

## 2018-12-31 ENCOUNTER — Emergency Department (HOSPITAL_COMMUNITY)
Admission: EM | Admit: 2018-12-31 | Discharge: 2018-12-31 | Discharge status: Against Medical Advice | Payer: Medicaid Other | Attending: Emergency Medicine | Admitting: Emergency Medicine

## 2018-12-31 ENCOUNTER — Encounter (HOSPITAL_COMMUNITY): Payer: Self-pay | Admitting: Emergency Medicine

## 2018-12-31 DIAGNOSIS — O26899 Other specified pregnancy related conditions, unspecified trimester: Secondary | ICD-10-CM

## 2018-12-31 DIAGNOSIS — R1030 Lower abdominal pain, unspecified: Secondary | ICD-10-CM

## 2018-12-31 DIAGNOSIS — R109 Unspecified abdominal pain: Secondary | ICD-10-CM | POA: Diagnosis not present

## 2018-12-31 DIAGNOSIS — Z3A10 10 weeks gestation of pregnancy: Secondary | ICD-10-CM | POA: Diagnosis not present

## 2018-12-31 DIAGNOSIS — Z5321 Procedure and treatment not carried out due to patient leaving prior to being seen by health care provider: Secondary | ICD-10-CM | POA: Insufficient documentation

## 2018-12-31 DIAGNOSIS — O26891 Other specified pregnancy related conditions, first trimester: Secondary | ICD-10-CM | POA: Diagnosis not present

## 2018-12-31 DIAGNOSIS — O9989 Other specified diseases and conditions complicating pregnancy, childbirth and the puerperium: Secondary | ICD-10-CM | POA: Diagnosis present

## 2018-12-31 DIAGNOSIS — R1084 Generalized abdominal pain: Secondary | ICD-10-CM | POA: Diagnosis not present

## 2018-12-31 DIAGNOSIS — Z3A00 Weeks of gestation of pregnancy not specified: Secondary | ICD-10-CM | POA: Insufficient documentation

## 2018-12-31 LAB — URINALYSIS, ROUTINE W REFLEX MICROSCOPIC
Bilirubin Urine: NEGATIVE
Glucose, UA: NEGATIVE mg/dL
Hgb urine dipstick: NEGATIVE
Ketones, ur: NEGATIVE mg/dL
Leukocytes,Ua: NEGATIVE
Nitrite: NEGATIVE
Protein, ur: NEGATIVE mg/dL
Specific Gravity, Urine: 1.014 (ref 1.005–1.030)
pH: 7 (ref 5.0–8.0)

## 2018-12-31 NOTE — ED Triage Notes (Signed)
Pt reports abdominal cramping for the last few days. Pt reports being pregnant with due date of July 27, 2019

## 2018-12-31 NOTE — ED Triage Notes (Signed)
Pt denies any vaginal bleeding.

## 2019-01-01 ENCOUNTER — Inpatient Hospital Stay (HOSPITAL_COMMUNITY): Payer: Medicaid Other

## 2019-01-01 ENCOUNTER — Encounter (HOSPITAL_COMMUNITY): Payer: Self-pay

## 2019-01-01 DIAGNOSIS — Z3A10 10 weeks gestation of pregnancy: Secondary | ICD-10-CM

## 2019-01-01 DIAGNOSIS — R1030 Lower abdominal pain, unspecified: Secondary | ICD-10-CM

## 2019-01-01 DIAGNOSIS — O26891 Other specified pregnancy related conditions, first trimester: Secondary | ICD-10-CM

## 2019-01-01 DIAGNOSIS — R109 Unspecified abdominal pain: Secondary | ICD-10-CM

## 2019-01-01 LAB — WET PREP, GENITAL
Clue Cells Wet Prep HPF POC: NONE SEEN
Sperm: NONE SEEN
Trich, Wet Prep: NONE SEEN
Yeast Wet Prep HPF POC: NONE SEEN

## 2019-01-01 LAB — ABO/RH: ABO/RH(D): O POS

## 2019-01-01 NOTE — MAU Provider Note (Signed)
Patient Judith Evans is a 18 y.o. G2P0010 at 2856w3d by early US here with complaints of abdominal pain that started last night, and then spotting today with more cramping this afternoon while she was at work. She denies dysuria, NV, diarrha, constipation, abnormal discharge.   She had a miscarriage in 2019 and so she came in to be checked out.  History     CSN: [id number]  Arrival date and time: 12/31/18 2356   None     Chief Complaint  Patient presents with  . Abdominal Pain   Abdominal Pain This is a new problem. The current episode started yesterday. The problem occurs intermittently. The pain is located in the suprapubic region. The pain is at a severity of 3/10. The quality of the pain is cramping. The abdominal pain does not radiate. Pertinent negatives include no constipation, diarrhea, dysuria, nausea or vomiting. Relieved by: drinking water.  Vaginal Bleeding This is a new problem. The problem occurs rarely. The problem has been resolved. Associated symptoms include abdominal pain. Pertinent negatives include no constipation, diarrhea, dysuria, nausea or vomiting.  She reports that she has no appetite; she gets nauseated when she doesn't eat.   OB History    Gravida  2   Para  0   Term      Preterm      AB  1   Living  0     SAB  1   TAB      Ectopic      Multiple      Live Births  0           Past Medical History:  Diagnosis Date  . Medical history non-contributory     Past Surgical History:  Procedure Laterality Date  . NO PAST SURGERIES      History reviewed. No pertinent family history.  Social History   Tobacco Use  . Smoking status: Never Smoker  . Smokeless tobacco: Never Used  Substance Use Topics  . Alcohol use: Never    Frequency: Never  . Drug use: Never    Allergies: No Known Allergies  No medications prior to admission.    Review of Systems  Constitutional: Negative.   HENT: Negative.   Eyes: Negative.    Gastrointestinal: Positive for abdominal pain. Negative for constipation, diarrhea, nausea and vomiting.  Genitourinary: Positive for vaginal bleeding. Negative for dysuria.   Physical Exam   Blood pressure 110/70, pulse 90, temperature 98.4 F (36.9 C), temperature source Oral, resp. rate 17, height 5' (1.524 m), weight 48.8 kg, last menstrual period 10/21/2018, SpO2 100 %.  Physical Exam  Constitutional: She is oriented to person, place, and time. She appears well-developed.  HENT:  Head: Normocephalic.  Neck: Normal range of motion.  Genitourinary:    Vagina normal.     Genitourinary Comments: NEFG; no blood or discharge in the vagina; cervix is pink with no lesions, no masses, no CMT, suprapubic or adnexal tenderness.    Musculoskeletal: Normal range of motion.  Neurological: She is alert and oriented to person, place, and time.  Skin: Skin is warm and dry.    MAU Course  Procedures  MDM -US shows IUP with cardiac activity; patient reassured by US findings.  --wet prep negative -GC pending -O positive blood type Assessment and Plan   1. Lower abdominal pain   2. Abdominal pain   3. Abdominal pain affecting pregnancy    -reviewed importance of small meals, even if she is not hungry.  -  patient to keep OB appt at [facility] -Return to MAU if she has any more spotting or abdominal pain.  -All questions answered, patient stable for discharge.     Mervyn Skeeters Kooistra 01/01/2019, 2:19 AM

## 2019-01-01 NOTE — Discharge Instructions (Signed)
Eating Plan for Pregnant Women °While you are pregnant, your body requires additional nutrition to help support your growing baby. You also have a higher need for some vitamins and minerals, such as folic acid, calcium, iron, and vitamin D. Eating a healthy, well-balanced diet is very important for your health and your baby's health. Your need for extra calories varies for the three 3-month segments of your pregnancy (trimesters). For most women, it is recommended to consume: °· 150 extra calories a day during the first trimester. °· 300 extra calories a day during the second trimester. °· 300 extra calories a day during the third trimester. °What are tips for following this plan? ° °· Do not try to lose weight or go on a diet during pregnancy. °· Limit your overall intake of foods that have "empty calories." These are foods that have little nutritional value, such as sweets, desserts, candies, and sugar-sweetened beverages. °· Eat a variety of foods (especially fruits and vegetables) to get a full range of vitamins and minerals. °· Take a prenatal vitamin to help meet your additional vitamin and mineral needs during pregnancy, specifically for folic acid, iron, calcium, and vitamin D. °· Remember to stay active. Ask your health care provider what types of exercise and activities are safe for you. °· Practice good food safety and cleanliness. Wash your hands before you eat and after you prepare raw meat. Wash all fruits and vegetables well before peeling or eating. Taking these actions can help to prevent food-borne illnesses that can be very dangerous to your baby, such as listeriosis. Ask your health care provider for more information about listeriosis. °What does 150 extra calories look like? °Healthy options that provide 150 extra calories each day could be any of the following: °· 6-8 oz (170-230 g) of plain low-fat yogurt with ½ cup of berries. °· 1 apple with 2 teaspoons (11 g) of peanut butter. °· Cut-up  vegetables with ¼ cup (60 g) of hummus. °· 8 oz (230 mL) or 1 cup of low-fat chocolate milk. °· 1 stick of string cheese with 1 medium orange. °· 1 peanut butter and jelly sandwich that is made with one slice of whole-wheat bread and 1 tsp (5 g) of peanut butter. °For 300 extra calories, you could eat two of those healthy options each day. °What is a healthy amount of weight to gain? °The right amount of weight gain for you is based on your BMI before you became pregnant. If your BMI: °· Was less than 18 (underweight), you should gain 28-40 lb (13-18 kg). °· Was 18-24.9 (normal), you should gain 25-35 lb (11-16 kg). °· Was 25-29.9 (overweight), you should gain 15-25 lb (7-11 kg). °· Was 30 or greater (obese), you should gain 11-20 lb (5-9 kg). °What if I am having twins or multiples? °Generally, if you are carrying twins or multiples: °· You may need to eat 300-600 extra calories a day. °· The recommended range for total weight gain is 25-54 lb (11-25 kg), depending on your BMI before pregnancy. °· Talk with your health care provider to find out about nutritional needs, weight gain, and exercise that is right for you. °What foods can I eat? ° °Grains °All grains. Choose whole grains, such as whole-wheat bread, oatmeal, or brown rice. °Vegetables °All vegetables. Eat a variety of colors and types of vegetables. Remember to wash your vegetables well before peeling or eating. °Fruits °All fruits. Eat a variety of colors and types of fruit. Remember to wash   your fruits well before peeling or eating. °Meats and other protein foods °Lean meats, including chicken, turkey, fish, and lean cuts of beef, veal, or pork. If you eat fish or seafood, choose options that are higher in omega-3 fatty acids and lower in mercury, such as salmon, herring, mussels, trout, sardines, pollock, shrimp, crab, and lobster. Tofu. Tempeh. Beans. Eggs. Peanut butter and other nut butters. Make sure that all meats, poultry, and eggs are cooked to  food-safe temperatures or "well-done." °Two or more servings of fish are recommended each week in order to get the most benefits from omega-3 fatty acids that are found in seafood. Choose fish that are lower in mercury. You can find more information online: °· www.fda.gov °Dairy °Pasteurized milk and milk alternatives (such as almond milk). Pasteurized yogurt and pasteurized cheese. Cottage cheese. Sour cream. °Beverages °Water. Juices that contain 100% fruit juice or vegetable juice. Caffeine-free teas and decaffeinated coffee. °Drinks that contain caffeine are okay to drink, but it is better to avoid caffeine. Keep your total caffeine intake to less than 200 mg each day (which is 12 oz or 355 mL of coffee, tea, or soda) or the limit as told by your health care provider. °Fats and oils °Fats and oils are okay to include in moderation. °Sweets and desserts °Sweets and desserts are okay to include in moderation. °Seasoning and other foods °All pasteurized condiments. °The items listed above may not be a complete list of recommended foods and beverages. Contact your dietitian for more options. °The items listed above may not be a complete list of foods and beverages [you/your child] can eat. Contact a dietitian for more information. °What foods are not recommended? °Vegetables °Raw (unpasteurized) vegetable juices. °Fruits °Unpasteurized fruit juices. °Meats and other protein foods °Lunch meats, bologna, hot dogs, or other deli meats. (If you must eat those meats, reheat them until they are steaming hot.) Refrigerated paté, meat spreads from a meat counter, smoked seafood that is found in the refrigerated section of a store. Raw or undercooked meats, poultry, and eggs. Raw fish, such as sushi or sashimi. Fish that have high mercury content, such as tilefish, shark, swordfish, and king mackerel. °To learn more about mercury in fish, talk with your health care provider or look for online resources, such  as: °· www.fda.gov °Dairy °Raw (unpasteurized) milk and any foods that have raw milk in them. Soft cheeses, such as feta, queso blanco, queso fresco, Brie, Camembert cheeses, blue-veined cheeses, and Panela cheese (unless it is made with pasteurized milk, which must be stated on the label). °Beverages °Alcohol. Sugar-sweetened beverages, such as sodas, teas, or energy drinks. °Seasoning and other foods °Homemade fermented foods and drinks, such as pickles, sauerkraut, or kombucha drinks. (Store-bought pasteurized versions of these are okay.) °Salads that are made in a store or deli, such as ham salad, chicken salad, egg salad, tuna salad, and seafood salad. °The items listed above may not be a complete list of foods and beverages to avoid. Contact your dietitian for more information. °The items listed above may not be a complete list of foods and beverages [you/your child] should avoid. Contact a dietitian for more information. °Where to find more information °To calculate the number of calories you need based on your height, weight, and activity level, you can use an online calculator such as: °· www.choosemyplate.gov/MyPlatePlan °To calculate how much weight you should gain during pregnancy, you can use an online pregnancy weight gain calculator such as: °· www.choosemyplate.gov/pregnancy-weight-gain-calculator °Summary °· While you   are pregnant, your body requires additional nutrition to help support your growing baby. °· Eat a variety of foods, especially fruits and vegetables to get a full range of vitamins and minerals. °· Practice good food safety and cleanliness. Wash your hands before you eat and after you prepare raw meat. Wash all fruits and vegetables well before peeling or eating. Taking these actions can help to prevent food-borne illnesses, such as listeriosis, that can be very dangerous to your baby. °· Do not eat raw meat or fish. Do not eat fish that have high mercury content, such as tilefish,  shark, swordfish, and king mackerel. Do not eat unpasteurized (raw) dairy. °· Take a prenatal vitamin to help meet your additional vitamin and mineral needs during pregnancy, specifically for folic acid, iron, calcium, and vitamin D. °This information is not intended to replace advice given to you by your health care provider. Make sure you discuss any questions you have with your health care provider. °Document Released: 02/02/2014 Document Revised: 08/11/2018 Document Reviewed: 01/15/2017 °Elsevier Patient Education © 2020 Elsevier Inc. ° °

## 2019-01-01 NOTE — MAU Note (Signed)
Patient presents to MAU c/o abdominal pain and cramping and some spotting that started on Friday and has gotten worse. Patient reports small amount of bleeding that started last night around 2100.  Patient denies passing clots or tissue.

## 2019-01-02 DIAGNOSIS — Z3400 Encounter for supervision of normal first pregnancy, unspecified trimester: Secondary | ICD-10-CM | POA: Insufficient documentation

## 2019-01-03 DIAGNOSIS — Z8619 Personal history of other infectious and parasitic diseases: Secondary | ICD-10-CM | POA: Insufficient documentation

## 2019-01-03 LAB — GC/CHLAMYDIA PROBE AMP (~~LOC~~) NOT AT ARMC
Chlamydia: NEGATIVE
Neisseria Gonorrhea: NEGATIVE

## 2019-01-17 LAB — OB RESULTS CONSOLE HEPATITIS B SURFACE ANTIGEN: Hepatitis B Surface Ag: NEGATIVE

## 2019-01-17 LAB — OB RESULTS CONSOLE RUBELLA ANTIBODY, IGM: Rubella: IMMUNE

## 2019-01-27 ENCOUNTER — Other Ambulatory Visit: Payer: Self-pay

## 2019-01-27 ENCOUNTER — Inpatient Hospital Stay (HOSPITAL_COMMUNITY)
Admission: AD | Admit: 2019-01-27 | Discharge: 2019-01-27 | Disposition: A | Discharge status: Home / Self Care | Payer: Medicaid Other | Attending: Obstetrics and Gynecology | Admitting: Obstetrics and Gynecology

## 2019-01-27 ENCOUNTER — Other Ambulatory Visit: Payer: Self-pay | Admitting: Certified Nurse Midwife

## 2019-01-27 ENCOUNTER — Encounter (HOSPITAL_COMMUNITY): Payer: Self-pay | Admitting: *Deleted

## 2019-01-27 DIAGNOSIS — Z3A14 14 weeks gestation of pregnancy: Secondary | ICD-10-CM | POA: Insufficient documentation

## 2019-01-27 DIAGNOSIS — R300 Dysuria: Secondary | ICD-10-CM | POA: Diagnosis not present

## 2019-01-27 DIAGNOSIS — O26892 Other specified pregnancy related conditions, second trimester: Secondary | ICD-10-CM | POA: Diagnosis not present

## 2019-01-27 DIAGNOSIS — Z3400 Encounter for supervision of normal first pregnancy, unspecified trimester: Secondary | ICD-10-CM

## 2019-01-27 DIAGNOSIS — O26899 Other specified pregnancy related conditions, unspecified trimester: Secondary | ICD-10-CM

## 2019-01-27 DIAGNOSIS — R102 Pelvic and perineal pain: Secondary | ICD-10-CM

## 2019-01-27 LAB — URINALYSIS, ROUTINE W REFLEX MICROSCOPIC
Bilirubin Urine: NEGATIVE
Glucose, UA: NEGATIVE mg/dL
Hgb urine dipstick: NEGATIVE
Ketones, ur: 5 mg/dL — AB
Leukocytes,Ua: NEGATIVE
Nitrite: NEGATIVE
Protein, ur: NEGATIVE mg/dL
Specific Gravity, Urine: 1.029 (ref 1.005–1.030)
pH: 6 (ref 5.0–8.0)

## 2019-01-27 LAB — CBC
HCT: 29.8 % — ABNORMAL LOW (ref 36.0–46.0)
Hemoglobin: 10.7 g/dL — ABNORMAL LOW (ref 12.0–15.0)
MCH: 33.4 pg (ref 26.0–34.0)
MCHC: 35.9 g/dL (ref 30.0–36.0)
MCV: 93.1 fL (ref 80.0–100.0)
Platelets: 237 10*3/uL (ref 150–400)
RBC: 3.2 MIL/uL — ABNORMAL LOW (ref 3.87–5.11)
RDW: 11.3 % — ABNORMAL LOW (ref 11.5–15.5)
WBC: 10.7 10*3/uL — ABNORMAL HIGH (ref 4.0–10.5)
nRBC: 0 % (ref 0.0–0.2)

## 2019-01-27 LAB — WET PREP, GENITAL
Clue Cells Wet Prep HPF POC: NONE SEEN
Sperm: NONE SEEN
Trich, Wet Prep: NONE SEEN
Yeast Wet Prep HPF POC: NONE SEEN

## 2019-01-27 MED ORDER — NITROFURANTOIN MONOHYD MACRO 100 MG PO CAPS: 100.0000 mg | ORAL_CAPSULE | Freq: Two times a day (BID) | ORAL | 0 refills | Status: DC

## 2019-01-27 NOTE — Discharge Instructions (Signed)
Dysuria Dysuria is pain or discomfort while urinating. The pain or discomfort may be felt in the part of your body that drains urine from the bladder (urethra) or in the surrounding tissue of the genitals. The pain may also be felt in the groin area, lower abdomen, or lower back. You may have to urinate frequently or have the sudden feeling that you have to urinate (urgency). Dysuria can affect both men and women, but it is more common in women. Dysuria can be caused by many different things, including:  Urinary tract infection.  Kidney stones or bladder stones.  Certain sexually transmitted infections (STIs), such as chlamydia.  Dehydration.  Inflammation of the tissues of the vagina.  Use of certain medicines.  Use of certain soaps or scented products that cause irritation. Follow these instructions at home: General instructions  Watch your condition for any changes.  Urinate often. Avoid holding urine for long periods of time.  After a bowel movement or urination, women should cleanse from front to back, using each tissue only once.  Urinate after sexual intercourse.  Keep all follow-up visits as told by your health care provider. This is important.  If you had any tests done to find the cause of dysuria, it is up to you to get your test results. Ask your health care provider, or the department that is doing the test, when your results will be ready. Eating and drinking   Drink enough fluid to keep your urine pale yellow.  Avoid caffeine, tea, and alcohol. They can irritate the bladder and make dysuria worse. In men, alcohol may irritate the prostate. Medicines  Take over-the-counter and prescription medicines only as told by your health care provider.  If you were prescribed an antibiotic medicine, take it as told by your health care provider. Do not stop taking the antibiotic even if you start to feel better. Contact a health care provider if:  You have a  fever.  You develop pain in your back or sides.  You have nausea or vomiting.  You have blood in your urine.  You are not urinating as often as you usually do. Get help right away if:  Your pain is severe and not relieved with medicines.  You cannot eat or drink without vomiting.  You are confused.  You have a rapid heartbeat while at rest.  You have shaking or chills.  You feel extremely weak. Summary  Dysuria is pain or discomfort while urinating. Many different conditions can lead to dysuria.  If you have dysuria, you may have to urinate frequently or have the sudden feeling that you have to urinate (urgency).  Watch your condition for any changes. Keep all follow-up visits as told by your health care provider.  Make sure that you urinate often and drink enough fluid to keep your urine pale yellow. This information is not intended to replace advice given to you by your health care provider. Make sure you discuss any questions you have with your health care provider. Document Released: 01/17/2004 Document Revised: 04/02/2017 Document Reviewed: 02/04/2017 Elsevier Patient Education  2020 Elsevier Inc.  Abdominal Pain During Pregnancy  Belly (abdominal) pain is common during pregnancy. There are many possible causes. Most of the time, it is not a serious problem. Other times, it can be a sign that something is wrong with the pregnancy. Always tell your doctor if you have belly pain. Follow these instructions at home:  Do not have sex or put anything in your vagina  until your pain goes away completely.  Get plenty of rest until your pain gets better.  Drink enough fluid to keep your pee (urine) pale yellow.  Take over-the-counter and prescription medicines only as told by your doctor.  Keep all follow-up visits as told by your doctor. This is important. Contact a doctor if:  Your pain continues or gets worse after resting.  You have lower belly pain that: ? Comes  and goes at regular times. ? Spreads to your back. ? Feels like menstrual cramps.  You have pain or burning when you pee (urinate). Get help right away if:  You have a fever or chills.  You have vaginal bleeding.  You are leaking fluid from your vagina.  You are passing tissue from your vagina.  You throw up (vomit) for more than 24 hours.  You have watery poop (diarrhea) for more than 24 hours.  Your baby is moving less than usual.  You feel very weak or faint.  You have shortness of breath.  You have very bad pain in your upper belly. Summary  Belly (abdominal) pain is common during pregnancy. There are many possible causes.  If you have belly pain during pregnancy, tell your doctor right away.  Keep all follow-up visits as told by your doctor. This is important. This information is not intended to replace advice given to you by your health care provider. Make sure you discuss any questions you have with your health care provider. Document Released: 04/08/2009 Document Revised: 08/08/2018 Document Reviewed: 07/23/2016 Elsevier Patient Education  2020 [place] for [place] for [place] @ [facility]   Phone: [place] for [place] @ [place]   Phone: [place] @[place]  [facility]       Phone: [place] for [place] @ [place]     Phone: [place] for [place] @ [employer]   Phone: [place] for [place] @ [facility]  Phone: [place] for [place] @ [place])  Phone: [phone]

## 2019-01-27 NOTE — MAU Provider Note (Signed)
History     CSN: [id number]  Arrival date and time: 01/27/19 1627   First Provider Initiated Contact with Patient 01/27/19 1705      Chief Complaint  Patient presents with  . Dysuria   18 y.o. G2P0010 @14 .1 wks presenting with dysuria and LAP. LAP started 2 days ago. Describes as intermittent pressure in lower center of abdomen. Rates 6/10. Has not tried anything for it. Worse while standing at work and worsening today. Dysuria started yesterday. Associated sx are urinary urgency. Denies other urinary sx. No fevers. No GI sx.   OB History    Gravida  2   Para  0   Term      Preterm      AB  1   Living  0     SAB  1   TAB      Ectopic      Multiple      Live Births  0           Past Medical History:  Diagnosis Date  . Medical history non-contributory     Past Surgical History:  Procedure Laterality Date  . NO PAST SURGERIES      History reviewed. No pertinent family history.  Social History   Tobacco Use  . Smoking status: Never Smoker  . Smokeless tobacco: Never Used  Substance Use Topics  . Alcohol use: Never    Frequency: Never  . Drug use: Never    Allergies: No Known Allergies  No medications prior to admission.    Review of Systems  Constitutional: Negative for chills and fever.  Gastrointestinal: Positive for abdominal pain. Negative for constipation, diarrhea, nausea and vomiting.  Genitourinary: Positive for dysuria, frequency and urgency. Negative for hematuria, vaginal bleeding and vaginal discharge.   Physical Exam   Blood pressure 109/63, pulse 99, temperature 99.4 F (37.4 C), temperature source Oral, resp. rate 17, weight 47.9 kg, last menstrual period 10/21/2018, SpO2 100 %.  Physical Exam  Nursing note and vitals reviewed. Constitutional: She is oriented to person, place, and time. She appears well-developed and well-nourished. No distress.  HENT:  Head: Normocephalic and atraumatic.  Neck: Normal range of motion.   Cardiovascular: Normal rate.  Respiratory: Effort normal. No respiratory distress.  GI: Soft. She exhibits no distension and no mass. There is no abdominal tenderness. There is no rebound, no guarding and no CVA tenderness.  Genitourinary:    Genitourinary Comments: VE: closed/long   Musculoskeletal: Normal range of motion.     Cervical back: Normal.     Thoracic back: Normal.     Lumbar back: Normal.  Neurological: She is alert and oriented to person, place, and time.  Skin: Skin is warm and dry.  Psychiatric: She has a normal mood and affect.  FHT 156  Results for orders placed or performed during the hospital encounter of 01/27/19 (from the past 24 hour(s))  Urinalysis, Routine w reflex microscopic     Status: Abnormal   Collection Time: 01/27/19  5:19 PM  Result Value Ref Range   Color, Urine YELLOW YELLOW   APPearance HAZY (A) CLEAR   Specific Gravity, Urine 1.029 1.005 - 1.030   pH 6.0 5.0 - 8.0   Glucose, UA NEGATIVE NEGATIVE mg/dL   Hgb urine dipstick NEGATIVE NEGATIVE   Bilirubin Urine NEGATIVE NEGATIVE   Ketones, ur 5 (A) NEGATIVE mg/dL   Protein, ur NEGATIVE NEGATIVE mg/dL   Nitrite NEGATIVE NEGATIVE   Leukocytes,Ua NEGATIVE NEGATIVE  Wet prep,  genital     Status: Abnormal   Collection Time: 01/27/19  5:20 PM   Specimen: Urine, Clean Catch  Result Value Ref Range   Yeast Wet Prep HPF POC NONE SEEN NONE SEEN   Trich, Wet Prep NONE SEEN NONE SEEN   Clue Cells Wet Prep HPF POC NONE SEEN NONE SEEN   WBC, Wet Prep HPF POC MANY (A) NONE SEEN   Sperm NONE SEEN   CBC     Status: Abnormal   Collection Time: 01/27/19  5:34 PM  Result Value Ref Range   WBC 10.7 (H) 4.0 - 10.5 K/uL   RBC 3.20 (L) 3.87 - 5.11 MIL/uL   Hemoglobin 10.7 (L) 12.0 - 15.0 g/dL   HCT 29.8 (L) 36.0 - 46.0 %   MCV 93.1 80.0 - 100.0 fL   MCH 33.4 26.0 - 34.0 pg   MCHC 35.9 30.0 - 36.0 g/dL   RDW 11.3 (L) 11.5 - 15.5 %   Platelets 237 150 - 400 K/uL   nRBC 0.0 0.0 - 0.2 %   MAU Course   Procedures  MDM Labs ordered and reviewed. No evidence of impending SAB. No obvious UTI, but since symptomatic will start abx and order UC. GC pending. Pt interested in switching practices- [place] provided. Stable for discharge home.   Assessment and Plan   1. [redacted] weeks gestation of pregnancy   2. Supervision of normal first pregnancy, antepartum   3. Pain of round ligament during pregnancy   4. Dysuria    Discharge home Follow up at [facility] as scheduled SAB precautions Rx Macrobid  Allergies as of 01/27/2019   No Known Allergies     Medication List    TAKE these medications   nitrofurantoin (macrocrystal-monohydrate) 100 MG capsule Commonly known as: MACROBID Take 1 capsule (100 mg total) by mouth 2 (two) times daily.      Julianne Handler, CNM 01/27/2019, 6:21 PM

## 2019-01-27 NOTE — MAU Note (Signed)
Pain and pressure with urination, keeps feeling like she needs to go.  Started 2 nights ago, worse today while at work.

## 2019-01-28 LAB — CULTURE, OB URINE: Culture: <10000 — AB

## 2019-01-31 LAB — CERVICOVAGINAL ANCILLARY ONLY
Chlamydia: NEGATIVE
Neisseria Gonorrhea: NEGATIVE

## 2019-02-21 DIAGNOSIS — O283 Abnormal ultrasonic finding on antenatal screening of mother: Secondary | ICD-10-CM | POA: Insufficient documentation

## 2019-03-30 ENCOUNTER — Encounter (HOSPITAL_COMMUNITY): Payer: Self-pay | Admitting: Obstetrics and Gynecology

## 2019-03-30 ENCOUNTER — Other Ambulatory Visit: Payer: Self-pay

## 2019-03-30 ENCOUNTER — Inpatient Hospital Stay (HOSPITAL_COMMUNITY)
Admission: AD | Admit: 2019-03-30 | Discharge: 2019-03-30 | Disposition: A | Discharge status: Home / Self Care | Payer: Medicaid Other | Attending: Obstetrics and Gynecology | Admitting: Obstetrics and Gynecology

## 2019-03-30 DIAGNOSIS — Z3400 Encounter for supervision of normal first pregnancy, unspecified trimester: Secondary | ICD-10-CM

## 2019-03-30 DIAGNOSIS — O26892 Other specified pregnancy related conditions, second trimester: Secondary | ICD-10-CM | POA: Diagnosis not present

## 2019-03-30 DIAGNOSIS — R300 Dysuria: Secondary | ICD-10-CM

## 2019-03-30 DIAGNOSIS — Z3A23 23 weeks gestation of pregnancy: Secondary | ICD-10-CM | POA: Insufficient documentation

## 2019-03-30 LAB — URINALYSIS, ROUTINE W REFLEX MICROSCOPIC
Bilirubin Urine: NEGATIVE
Glucose, UA: NEGATIVE mg/dL
Hgb urine dipstick: NEGATIVE
Ketones, ur: NEGATIVE mg/dL
Nitrite: NEGATIVE
Protein, ur: NEGATIVE mg/dL
Specific Gravity, Urine: 1.02 (ref 1.005–1.030)
pH: 7 (ref 5.0–8.0)

## 2019-03-30 LAB — WET PREP, GENITAL
Clue Cells Wet Prep HPF POC: NONE SEEN
Sperm: NONE SEEN
Trich, Wet Prep: NONE SEEN
Yeast Wet Prep HPF POC: NONE SEEN

## 2019-03-30 MED ORDER — PHENAZOPYRIDINE HCL 200 MG PO TABS: 200.0000 mg | ORAL_TABLET | Freq: Three times a day (TID) | ORAL | 0 refills | Status: DC

## 2019-03-30 MED ORDER — CEFADROXIL 500 MG PO CAPS: 500.0000 mg | ORAL_CAPSULE | Freq: Two times a day (BID) | ORAL | 0 refills | Status: AC

## 2019-03-30 NOTE — MAU Provider Note (Signed)
History     CSN: [id number]  Arrival date and time: 03/30/19 7062   First Provider Initiated Contact with Patient 03/30/19 0912      Chief Complaint  Patient presents with  . Vaginal Discharge  . Dysuria   HPI  Ms.  Judith Evans is a 18 y.o. year old G19P0010 female at [redacted]w[redacted]d weeks gestation who presents to MAU reporting that she keep feeling like she has to pee, increased pressure, doesn't have to go when she gets to the BR, and sometimes burning with urination x 3 days. She receives [facility] at [facility] in [place]. Her last appt there was 03/21/19; next appt is 04/18/19. She reports no pregnancy complications  Past Medical History:  Diagnosis Date  . Medical history non-contributory     Past Surgical History:  Procedure Laterality Date  . NO PAST SURGERIES      History reviewed. No pertinent family history.  Social History   Tobacco Use  . Smoking status: Never Smoker  . Smokeless tobacco: Never Used  Substance Use Topics  . Alcohol use: Never    Frequency: Never  . Drug use: Never    Allergies: No Known Allergies  Medications Prior to Admission  Medication Sig Dispense Refill Last Dose  . nitrofurantoin, macrocrystal-monohydrate, (MACROBID) 100 MG capsule Take 1 capsule (100 mg total) by mouth 2 (two) times daily. 14 capsule 0     Review of Systems  Constitutional: Negative.   HENT: Negative.   Eyes: Negative.   Respiratory: Negative.   Cardiovascular: Negative.   Gastrointestinal: Negative.   Endocrine: Negative.   Genitourinary: Positive for dysuria, frequency, pelvic pain (pressure) and vaginal bleeding.  Musculoskeletal: Negative.   Skin: Negative.   Allergic/Immunologic: Negative.   Neurological: Negative.   Hematological: Negative.   Psychiatric/Behavioral: Negative.    Physical Exam   Blood pressure 104/63, pulse 83, temperature 98.2 F (36.8 C), temperature source Oral, resp. rate 16, height 5' (1.524 m), weight 54.9  kg, last menstrual period 10/21/2018, SpO2 100 %.  Physical Exam  Nursing note and vitals reviewed. Constitutional: She is oriented to person, place, and time. She appears well-developed and well-nourished.  HENT:  Head: Normocephalic and atraumatic.  Eyes: Pupils are equal, round, and reactive to light.  Neck: Normal range of motion.  Cardiovascular: Normal rate and regular rhythm.  Respiratory: Effort normal.  GI: Soft.  Genitourinary:    Genitourinary Comments: Uterus: gravid, S=D, SE: cervix is smooth, pink, no lesions, moderate amt of thick, white vaginal d/c -- WP, GC/CT done, closed/long/firm, no CMT or friability, no adnexal tenderness   Musculoskeletal: Normal range of motion.  Neurological: She is alert and oriented to person, place, and time.  Skin: Skin is warm and dry.  Psychiatric: She has a normal mood and affect. Her behavior is normal. Judgment and thought content normal.   FHTs by doppler: 147 bpm  MAU Course  Procedures  MDM CCUA UCx Wet Prep GC/CT   Results for orders placed or performed during the hospital encounter of 03/30/19 (from the past 24 hour(s))  Urinalysis, Routine w reflex microscopic     Status: Abnormal   Collection Time: 03/30/19  9:18 AM  Result Value Ref Range   Color, Urine YELLOW YELLOW   APPearance CLOUDY (A) CLEAR   Specific Gravity, Urine 1.020 1.005 - 1.030   pH 7.0 5.0 - 8.0   Glucose, UA NEGATIVE NEGATIVE mg/dL   Hgb urine dipstick NEGATIVE NEGATIVE   Bilirubin Urine NEGATIVE NEGATIVE  Ketones, ur NEGATIVE NEGATIVE mg/dL   Protein, ur NEGATIVE NEGATIVE mg/dL   Nitrite NEGATIVE NEGATIVE   Leukocytes,Ua SMALL (A) NEGATIVE   RBC / HPF 0-5 0 - 5 RBC/hpf   WBC, UA 0-5 0 - 5 WBC/hpf   Bacteria, UA RARE (A) NONE SEEN   Squamous Epithelial / LPF 11-20 0 - 5   Mucus PRESENT   Wet prep, genital     Status: Abnormal   Collection Time: 03/30/19  9:24 AM   Specimen: PATH Cytology Cervicovaginal Ancillary Only  Result Value Ref  Range   Yeast Wet Prep HPF POC NONE SEEN NONE SEEN   Trich, Wet Prep NONE SEEN NONE SEEN   Clue Cells Wet Prep HPF POC NONE SEEN NONE SEEN   WBC, Wet Prep HPF POC MANY (A) NONE SEEN   Sperm NONE SEEN     Assessment and Plan  Dysuria during pregnancy in second trimester  - Rx Cefadroxil 500 mg BID  PO x 10 days - Information provided on UTI in pregnancy   - Discharge patient - Keep scheduled appt with OB on 04/18/2019 - Patient verbalized an understanding of the plan of care and agrees.     Raelyn Mora, MSN, CNM 03/30/2019, 9:12 AM

## 2019-03-30 NOTE — MAU Note (Signed)
Keeps feeling like she needs to pee, pressure and she don't need to go, started 3 days ago, sometimes has burning with.  Has a real bad d/c, not normal.

## 2019-03-31 LAB — GC/CHLAMYDIA PROBE AMP (~~LOC~~) NOT AT ARMC
Chlamydia: NEGATIVE
Comment: NEGATIVE
Comment: NORMAL
Neisseria Gonorrhea: NEGATIVE

## 2019-03-31 LAB — CULTURE, OB URINE: Special Requests: NORMAL

## 2019-04-18 LAB — HIV ANTIBODY (ROUTINE TESTING W REFLEX): HIV Screen 4th Generation wRfx: NONREACTIVE

## 2019-05-05 NOTE — L&D Delivery Note (Addendum)
Delivery Note At 11:52 AM, on July 14, 2019, a viable female-Asyah was delivered via Vaginal, Spontaneous (Presentation: Left Occiput Anterior with partial cord delivered with head). Shoulders delivered easily and infant with good tone and spontaneous cry. Infant immediately placed on mother's abdomen where nurse's provided tactile stimulation and bulb suction. Infant  APGAR: 8, 9.  After 1.5 minute delay, the umbilical cord was clamped, cut by support person, and blood collected by provider. Placenta delivered spontaneously via Tomasa Blase and was noted to be intact with 3VC upon inspection.  Vaginal inspection revealed no lacerations.  Fundus firm, at the umbilicus, and bleeding small.  However, upon nurse initial fundal rub patient with of clots onto the pad.  Bleeding small after incident and patient given of buccal cytotec and nurse instructed to monitor bleeding.  Mother hemodynamically stable and infant skin to skin prior to provider exit.  Mother desires Nexplanon for birth control method and opts to bottlefeed.  Infant weight at one hour of life: 5lbs 10oz, 19in.  Anesthesia: None Episiotomy: None Lacerations: None Suture Repair: None Est. Blood Loss (mL): 165  Mom to postpartum.  Baby to Couplet care / Skin to Skin.  Cherre Robins 07/14/2019, 12:29 PM  Reassessment 1237  -Nurse call reports patient with large gush of clots upon fundal massage.  Provider to bedside and with clots on pad.  Patient given TXA 1g IV. Patient allowed to sit on bedpan with of urine out. Manual exploration removed an additional of clots.  Bleeding improved and patient with overall loss of .  Dr. Penne Lash informed of patient status and interventions. Continue mgmt as ordered.  Reassessment 1320    -Nurse calls reports patient with another large gush of clots. Dr. Penne Lash called to bedside and performed manual exploration with of clots removed. Bladder scan revealed of  urine and straight cath with out.  Patient given cytotec PR and additional bag of TXA.  Bleeding improved and nurse instructed to monitor and report as appropriate. Ancef 2 gram ordered for prophylaxis.  Total QBL  Reassessment 1527 -Patient to [facility] and with temperature of 101.3.  Nurse instructed to give tylenol now and recheck temperature. Per L. Pezzuto consider additional antibiotics if temperature remains throughout the night.   Cherre Robins MSN, CNM Advanced Practice Provider, [facility] [employer]

## 2019-05-21 ENCOUNTER — Emergency Department (HOSPITAL_COMMUNITY)
Admission: EM | Admit: 2019-05-21 | Discharge: 2019-05-21 | Disposition: A | Discharge status: Home / Self Care | Payer: Medicaid Other | Attending: Emergency Medicine | Admitting: Emergency Medicine

## 2019-05-21 DIAGNOSIS — Z5321 Procedure and treatment not carried out due to patient leaving prior to being seen by health care provider: Secondary | ICD-10-CM | POA: Insufficient documentation

## 2019-05-21 DIAGNOSIS — R0602 Shortness of breath: Secondary | ICD-10-CM | POA: Diagnosis not present

## 2019-05-24 ENCOUNTER — Ambulatory Visit (INDEPENDENT_AMBULATORY_CARE_PROVIDER_SITE_OTHER): Payer: Medicaid Other | Admitting: Obstetrics and Gynecology

## 2019-05-24 ENCOUNTER — Other Ambulatory Visit: Payer: Self-pay

## 2019-05-24 VITALS — BP 100/69 | HR 94 | Temp 98.5°F | Wt 129.0 lb

## 2019-05-24 DIAGNOSIS — Z3A30 30 weeks gestation of pregnancy: Secondary | ICD-10-CM

## 2019-05-24 DIAGNOSIS — Z3400 Encounter for supervision of normal first pregnancy, unspecified trimester: Secondary | ICD-10-CM

## 2019-05-24 DIAGNOSIS — IMO0002 Reserved for concepts with insufficient information to code with codable children: Secondary | ICD-10-CM

## 2019-05-24 DIAGNOSIS — O98819 Other maternal infectious and parasitic diseases complicating pregnancy, unspecified trimester: Secondary | ICD-10-CM | POA: Insufficient documentation

## 2019-05-24 DIAGNOSIS — A749 Chlamydial infection, unspecified: Secondary | ICD-10-CM

## 2019-05-24 DIAGNOSIS — Z8759 Personal history of other complications of pregnancy, childbirth and the puerperium: Secondary | ICD-10-CM | POA: Insufficient documentation

## 2019-05-24 DIAGNOSIS — O358XX0 Maternal care for other (suspected) fetal abnormality and damage, not applicable or unspecified: Secondary | ICD-10-CM

## 2019-05-24 DIAGNOSIS — O26892 Other specified pregnancy related conditions, second trimester: Secondary | ICD-10-CM

## 2019-05-24 HISTORY — DX: Chlamydial infection, unspecified: A74.9

## 2019-05-24 MED ORDER — BLOOD PRESSURE KIT DEVI: 1.0000 | 0 refills | Status: AC

## 2019-05-24 NOTE — Progress Notes (Signed)
History:   Tequlia Gonsalves is a 19 y.o. G2P0010 at [redacted]w[redacted]d by LMP being seen today for her first obstetrical visit.  Her obstetrical history is significant for history of miscarriage @ 8 weeks, chlaymdia, echogenic bowel of fetus.. Patient does intend to breast feed. Pregnancy history fully reviewed. Unplanned pregnancy, feeling good about It.  Transfer of care from [facility]. Wishes to deliver at [facility].   Patient reports no complaints.  HISTORY: OB History  Gravida Para Term Preterm AB Living  2 0 0 0 1 0  SAB TAB Ectopic Multiple Live Births  1 0 0 0 0    # Outcome Date GA Lbr Len/2nd Weight Sex Delivery Anes PTL Lv  2 Current           1 SAB             Last pap smear was done NA age.   Past Medical History:  Diagnosis Date  . Medical history non-contributory    Past Surgical History:  Procedure Laterality Date  . NO PAST SURGERIES     No family history on file. Social History   Tobacco Use  . Smoking status: Never Smoker  . Smokeless tobacco: Never Used  Substance Use Topics  . Alcohol use: Never  . Drug use: Never   No Known Allergies Current Outpatient Medications on File Prior to Visit  Medication Sig Dispense Refill  . phenazopyridine (PYRIDIUM) 200 MG tablet Take 1 tablet (200 mg total) by mouth 3 (three) times daily. (Patient not taking: Reported on 05/24/2019) 6 tablet 0   No current facility-administered medications on file prior to visit.    Review of Systems Pertinent items noted in HPI and remainder of comprehensive ROS otherwise negative. Physical Exam:   Vitals:   05/24/19 1318  BP: 100/69  Pulse: 94  Temp: 98.5 F (36.9 C)  Weight: 129 lb (58.5 kg)   Fetal Heart Rate (bpm): 146 Uterus:  Fundal Height: 31 cm   Skin: normal coloration and turgor, no rashes   Neurologic: oriented, normal, negative, normal mood   Extremities: normal strength, tone, and muscle mass, ROM of all joints is normal   HEENT PERRLA,  extraocular movement intact and sclera clear, anicteric   Mouth/Teeth mucous membranes moist, pharynx normal without lesions and dental hygiene good   Neck supple and no masses   Cardiovascular: regular rate and rhythm   Respiratory:  no respiratory distress, normal breath sounds   Abdomen: soft, non-tender; bowel sounds normal; no masses,  no organomegaly    Assessment:    Pregnancy: G2P0010 Patient Active Problem List   Diagnosis Date Noted  . Chlamydia infection 05/24/2019  . History of miscarriage 05/24/2019  . Dysuria during pregnancy in second trimester 03/30/2019  . Supervision of normal first pregnancy, antepartum 12/12/2018     Plan:   1. Supervision of normal first pregnancy, antepartum  - Enroll Patient in Babyscripts - [place] MFM OB FOLLOW UP; Future - [place] MFM OB DETAIL +14 WK; Future  2. Dysuria during pregnancy in second trimester   3. Chlamydia infection  TOC 11/26: Negative   4. History of miscarriage   5. Echogenic bowel of fetus  - [place] MFM OB FOLLOW UP; Future - [place] MFM OB DETAIL +14 WK; Future   Continue prenatal vitamins Continue ensure. Good weight gain in this pregnancy.  Problem list reviewed and updated. The nature of [place] with multiple MDs and other Advanced  Practice Providers was explained to patient; also emphasized that residents, students are part of our team. Routine obstetric precautions reviewed. No follow-ups on file.      Kalis Friese, Harolyn Rutherford, NP Faculty Practice [facility] [employer], [facility]

## 2019-05-29 ENCOUNTER — Encounter: Payer: Self-pay | Admitting: Family Medicine

## 2019-06-01 ENCOUNTER — Telehealth: Payer: Self-pay

## 2019-06-01 ENCOUNTER — Other Ambulatory Visit: Payer: Self-pay

## 2019-06-01 DIAGNOSIS — Z3400 Encounter for supervision of normal first pregnancy, unspecified trimester: Secondary | ICD-10-CM

## 2019-06-01 MED ORDER — MISC. DEVICES MISC: 0 refills | Status: DC

## 2019-06-01 NOTE — Telephone Encounter (Signed)
Return call to pt regarding discomfort Pt advised of comfort measures  And made aware dehydration and constipation Will cause discomfort Pt states she has not  Ha a BM since Sunday Pt made aware of safe medication list and things she should try to move her bowels Pt advised to refer to NOB packet and take to pharmacy with her when purchasing OTC medicine. Pt agreeable  Maternity belt Rx printed will be faxed today. Pt made aware of labor warning and signs to report to the hospital.

## 2019-06-07 ENCOUNTER — Telehealth (INDEPENDENT_AMBULATORY_CARE_PROVIDER_SITE_OTHER): Payer: Medicaid Other | Admitting: Certified Nurse Midwife

## 2019-06-07 ENCOUNTER — Telehealth: Payer: Medicaid Other | Admitting: Certified Nurse Midwife

## 2019-06-07 ENCOUNTER — Encounter: Payer: Self-pay | Admitting: Certified Nurse Midwife

## 2019-06-07 DIAGNOSIS — IMO0002 Reserved for concepts with insufficient information to code with codable children: Secondary | ICD-10-CM | POA: Insufficient documentation

## 2019-06-07 DIAGNOSIS — O358XX0 Maternal care for other (suspected) fetal abnormality and damage, not applicable or unspecified: Secondary | ICD-10-CM

## 2019-06-07 DIAGNOSIS — Z3400 Encounter for supervision of normal first pregnancy, unspecified trimester: Secondary | ICD-10-CM

## 2019-06-07 DIAGNOSIS — Z3A32 32 weeks gestation of pregnancy: Secondary | ICD-10-CM

## 2019-06-07 NOTE — Progress Notes (Signed)
ROB   CC: pt notes lower abdominal pain at night

## 2019-06-07 NOTE — Progress Notes (Signed)
TELEHEALTH OBSTETRICS PRENATAL VIRTUAL VIDEO VISIT ENCOUNTER NOTE  Provider location: [facility] [place]   I connected with Saira Fout on 06/07/19 at  3:19 PM EST by MyChart Video Encounter at home and verified that I am speaking with the correct person using two identifiers.   I discussed the limitations, risks, security and privacy concerns of performing an evaluation and management service virtually and the availability of in person appointments. I also discussed with the patient that there may be a patient responsible charge related to this service. The patient expressed understanding and agreed to proceed. Subjective:  Judith Evans is a 19 y.o. G2P0010 at [redacted]w[redacted]d being seen today for ongoing prenatal care.  She is currently monitored for the following issues for this low-risk pregnancy and has Supervision of normal first pregnancy, antepartum; Dysuria during pregnancy in second trimester; Chlamydia infection affecting pregnancy; and History of miscarriage on their problem list.  Patient reports occasional lower abdominal pressure.  Contractions: Irritability. Vag. Bleeding: None.  Movement: Present. Denies any leaking of fluid.   The following portions of the patient's history were reviewed and updated as appropriate: allergies, current medications, past family history, past medical history, past social history, past surgical history and problem list.   Objective:  There were no vitals filed for this visit.  Fetal Status:     Movement: Present     General:  Alert, oriented and cooperative. Patient is in no acute distress.  Respiratory: Normal respiratory effort, no problems with respiration noted  Mental Status: Normal mood and affect. Normal behavior. Normal judgment and thought content.  Rest of physical exam deferred due to type of encounter  Imaging: No results found.  Assessment and Plan:  Pregnancy: G2P0010 at [redacted]w[redacted]d 1. Supervision of normal first  pregnancy, antepartum - Patient doing well - Patient reports occasional tightening and lower abdominal pressure that last for less than 10 seconds then resolves. She reports that it usually occurs when she lays down at night but once she changes positions it resolves.  - Educated and discussed [place] contractions, PTL precautions and warning signs with patient  - Patient reports BP was 95/70 yesterday, has not placed BP into app- reports that she forgets to put blood pressures in app, encouraged to enter BP into babyscripts so that BP can be monitored, patient verbalizes understanding  - Routine prenatal care - Anticipatory guidance on upcoming appointments with next being mychart appointment - Encouraged patient to call/send message with any questions or concerns   2. Echogenic bowel of fetus - Educated and discussed finding of [place] based on prenatal records from [place]  - Patient scheduled for [place] on 2/5 to assess bowel and obtain fetal growth  - EFW on 1/7 based on chart review was 42% with AC 6.6%    Preterm labor symptoms and general obstetric precautions including but not limited to vaginal bleeding, contractions, leaking of fluid and fetal movement were reviewed in detail with the patient. I discussed the assessment and treatment plan with the patient. The patient was provided an opportunity to ask questions and all were answered. The patient agreed with the plan and demonstrated an understanding of the instructions. The patient was advised to call back or seek an in-person office evaluation/go to MAU at [facility] for any urgent or concerning symptoms. Please refer to After Visit Summary for other counseling recommendations.   I provided 15 minutes of face-to-face time during this encounter.  Return in about 2 weeks (around  06/21/2019) for ROB-mychart.  Future Appointments  Date Time Provider Department Center  06/09/2019  2:00 PM WH-MFC [place] 3 WH-MFCUS MFC-US  06/09/2019  2:15  PM WH-MFC NURSE WH-MFC MFC-US    Sharyon Cable, CNM [facility] [employer], [facility]

## 2019-06-09 ENCOUNTER — Encounter (HOSPITAL_COMMUNITY): Payer: Self-pay

## 2019-06-09 ENCOUNTER — Ambulatory Visit (HOSPITAL_COMMUNITY)
Admission: RE | Admit: 2019-06-09 | Discharge: 2019-06-09 | Disposition: A | Discharge status: Home / Self Care | Payer: Medicaid Other | Source: Ambulatory Visit | Attending: Obstetrics and Gynecology | Admitting: Obstetrics and Gynecology

## 2019-06-09 ENCOUNTER — Other Ambulatory Visit: Payer: Self-pay

## 2019-06-09 ENCOUNTER — Other Ambulatory Visit (HOSPITAL_COMMUNITY): Payer: Self-pay | Admitting: *Deleted

## 2019-06-09 ENCOUNTER — Ambulatory Visit (HOSPITAL_COMMUNITY): Payer: Medicaid Other | Admitting: *Deleted

## 2019-06-09 DIAGNOSIS — Z8759 Personal history of other complications of pregnancy, childbirth and the puerperium: Secondary | ICD-10-CM | POA: Diagnosis present

## 2019-06-09 DIAGNOSIS — A749 Chlamydial infection, unspecified: Secondary | ICD-10-CM | POA: Insufficient documentation

## 2019-06-09 DIAGNOSIS — Z3400 Encounter for supervision of normal first pregnancy, unspecified trimester: Secondary | ICD-10-CM

## 2019-06-09 DIAGNOSIS — O26892 Other specified pregnancy related conditions, second trimester: Secondary | ICD-10-CM | POA: Diagnosis present

## 2019-06-09 DIAGNOSIS — R300 Dysuria: Secondary | ICD-10-CM | POA: Diagnosis present

## 2019-06-09 DIAGNOSIS — O358XX0 Maternal care for other (suspected) fetal abnormality and damage, not applicable or unspecified: Secondary | ICD-10-CM | POA: Insufficient documentation

## 2019-06-09 DIAGNOSIS — IMO0002 Reserved for concepts with insufficient information to code with codable children: Secondary | ICD-10-CM

## 2019-06-09 DIAGNOSIS — Z3A33 33 weeks gestation of pregnancy: Secondary | ICD-10-CM | POA: Diagnosis not present

## 2019-06-09 DIAGNOSIS — O283 Abnormal ultrasonic finding on antenatal screening of mother: Secondary | ICD-10-CM | POA: Diagnosis not present

## 2019-06-09 DIAGNOSIS — O98819 Other maternal infectious and parasitic diseases complicating pregnancy, unspecified trimester: Secondary | ICD-10-CM | POA: Insufficient documentation

## 2019-06-09 DIAGNOSIS — Z362 Encounter for other antenatal screening follow-up: Secondary | ICD-10-CM

## 2019-06-13 ENCOUNTER — Telehealth: Payer: Self-pay | Admitting: *Deleted

## 2019-06-13 NOTE — Telephone Encounter (Signed)
Pt called to office stating she was seen at hospital when she was out of town over the weekend. Pt states she had been having some ctx and a lot of pelvic pressure. Pt was told her cervix is softening but closed.  Pt was advised to contact office once she was back in town. Pt made aware that if she continues to have ctx - she should be seen at hospital for evaluation. Pt made aware that she may continue to have pelvic pressure which is normal, but if becomes severe and ctx she should also be evaluated.   Pt states understanding.

## 2019-06-14 ENCOUNTER — Other Ambulatory Visit: Payer: Self-pay

## 2019-06-14 ENCOUNTER — Ambulatory Visit (INDEPENDENT_AMBULATORY_CARE_PROVIDER_SITE_OTHER): Payer: Medicaid Other | Admitting: Advanced Practice Midwife

## 2019-06-14 VITALS — BP 119/68 | HR 103 | Temp 98.3°F | Wt 134.8 lb

## 2019-06-14 DIAGNOSIS — O358XX0 Maternal care for other (suspected) fetal abnormality and damage, not applicable or unspecified: Secondary | ICD-10-CM

## 2019-06-14 DIAGNOSIS — O4703 False labor before 37 completed weeks of gestation, third trimester: Secondary | ICD-10-CM

## 2019-06-14 DIAGNOSIS — Z3A33 33 weeks gestation of pregnancy: Secondary | ICD-10-CM | POA: Diagnosis not present

## 2019-06-14 DIAGNOSIS — IMO0002 Reserved for concepts with insufficient information to code with codable children: Secondary | ICD-10-CM

## 2019-06-14 DIAGNOSIS — Z3483 Encounter for supervision of other normal pregnancy, third trimester: Secondary | ICD-10-CM

## 2019-06-14 DIAGNOSIS — O4700 False labor before 37 completed weeks of gestation, unspecified trimester: Secondary | ICD-10-CM

## 2019-06-14 DIAGNOSIS — O479 False labor, unspecified: Secondary | ICD-10-CM

## 2019-06-14 LAB — POCT URINALYSIS DIPSTICK
Bilirubin, UA: NEGATIVE
Blood, UA: NEGATIVE
Glucose, UA: NEGATIVE
Ketones, UA: NEGATIVE
Nitrite, UA: NEGATIVE
Protein, UA: POSITIVE — AB
Spec Grav, UA: 1.02 (ref 1.010–1.025)
Urobilinogen, UA: 0.2 E.U./dL
pH, UA: 5 (ref 5.0–8.0)

## 2019-06-14 MED ORDER — CYCLOBENZAPRINE HCL 10 MG PO TABS: 10.0000 mg | ORAL_TABLET | Freq: Three times a day (TID) | ORAL | 2 refills | Status: DC | PRN

## 2019-06-14 NOTE — Progress Notes (Signed)
ROB.  C/o pain and pressure 6-7/10 x 1 day.

## 2019-06-14 NOTE — Progress Notes (Signed)
   PRENATAL VISIT NOTE  Subjective:  Judith Evans is a 19 y.o. G2P0010 at [redacted]w[redacted]d being seen today for evaluation of preterm contractions.  She is currently monitored for the following issues for this low-risk pregnancy and has Supervision of normal first pregnancy, antepartum; Dysuria during pregnancy in second trimester; Chlamydia infection affecting pregnancy; History of miscarriage; and Echogenic bowel of fetus on their problem list.  Patient reports recurrent preterm contractions. Onset last weekend, then resolved without intervention. Patient traveled with her mother to her hometown and pain became intense and regular. She was evaluated at a local ED and told that her cervix was "closed but softer than it should be".  Contractions: Irregular. Vag. Bleeding: None.  Movement: Present. Denies leaking of fluid.   Patient endorses feeling two contractions in the past 45 minutes. She denies vaginal bleeding, leaking of fluid, decreased fetal movement, fever, falls, or recent illness.   The following portions of the patient's history were reviewed and updated as appropriate: allergies, current medications, past family history, past medical history, past social history, past surgical history and problem list. Problem list updated.  Objective:   Vitals:   06/14/19 1511  BP: 119/68  Pulse: (!) 103  Temp: 98.3 F (36.8 C)  Weight: 134 lb 12.8 oz (61.1 kg)    Fetal Status: Fetal Heart Rate (bpm): 148   Movement: Present     General:  Alert, oriented and cooperative. Patient is in no acute distress.  Skin: Skin is warm and dry. No rash noted.   Cardiovascular: Normal heart rate noted  Respiratory: Normal respiratory effort, no problems with respiration noted  Abdomen: Soft, gravid, appropriate for gestational age.  Pain/Pressure: Present     Pelvic: Cervical exam performed Dilation: Closed Effacement (%): 50 Station: Ballotable  Extremities: Normal range of motion.  Edema: Mild pitting,  slight indentation  Mental Status: Normal mood and affect. Normal behavior. Normal judgment and thought content.   Assessment and Plan:  Pregnancy: G2P0010 at [redacted]w[redacted]d  1. Encounter for supervision of other normal pregnancy in third trimester - Routine care  2. Preterm contractions - Patient endorses two contractions 45 minutes since sign-in      - Cervix remains closed/50% and long/ midposition - FFN collected prior to SVE, not sent due to closed cervix - POCT Urinalysis Dipstick (normal) - Fetal nonstress test (Reactive, baseline 140, mod variability, positive accels, no decels) - Toco: Uterine irritability noted  3. Fetal echogenic bowel - Not visualized on most recent MFM scan 06/09/2019  Follow-up -Given irregular, non-palpable contractions, only occasionally felt by patient and  In setting of closed cervix, patient advised to attempt trial of Tylenol + Flexeril, large PO hydration with water, and rest this evening.   - Patient strongly cautioned to present to MAU for discomfort not relieved by aforementioned interventions or more frequent painful contractions, LOF or any amount of vaginal bleeding  Preterm labor symptoms and general obstetric precautions including but not limited to vaginal bleeding, contractions, leaking of fluid and fetal movement were reviewed in detail with the patient. Please refer to After Visit Summary for other counseling recommendations.   Future Appointments  Date Time Provider Department Center  06/21/2019 11:00 AM Brock Bad, MD [facility] None  07/07/2019  2:15 PM [facility] NURSE [facility]  07/07/2019  2:15 PM [facility] [place] 4 [facility]    Calvert Cantor, CNM

## 2019-06-14 NOTE — Patient Instructions (Signed)
Braxton Hicks Contractions °Contractions of the uterus can occur throughout pregnancy, but they are not always a sign that you are in labor. You may have practice contractions called Braxton Hicks contractions. These false labor contractions are sometimes confused with true labor. °What are Braxton Hicks contractions? °Braxton Hicks contractions are tightening movements that occur in the muscles of the uterus before labor. Unlike true labor contractions, these contractions do not result in opening (dilation) and thinning of the cervix. Toward the end of pregnancy (32-34 weeks), Braxton Hicks contractions can happen more often and may become stronger. These contractions are sometimes difficult to tell apart from true labor because they can be very uncomfortable. You should not feel embarrassed if you go to the hospital with false labor. °Sometimes, the only way to tell if you are in true labor is for your health care provider to look for changes in the cervix. The health care provider will do a physical exam and may monitor your contractions. If you are not in true labor, the exam should show that your cervix is not dilating and your water has not broken. °If there are no other health problems associated with your pregnancy, it is completely safe for you to be sent home with false labor. You may continue to have Braxton Hicks contractions until you go into true labor. °How to tell the difference between true labor and false labor °True labor °· Contractions last 30-70 seconds. °· Contractions become very regular. °· Discomfort is usually felt in the top of the uterus, and it spreads to the lower abdomen and low back. °· Contractions do not go away with walking. °· Contractions usually become more intense and increase in frequency. °· The cervix dilates and gets thinner. °False labor °· Contractions are usually shorter and not as strong as true labor contractions. °· Contractions are usually irregular. °· Contractions  are often felt in the front of the lower abdomen and in the groin. °· Contractions may go away when you walk around or change positions while lying down. °· Contractions get weaker and are shorter-lasting as time goes on. °· The cervix usually does not dilate or become thin. °Follow these instructions at home: ° °· Take over-the-counter and prescription medicines only as told by your health care provider. °· Keep up with your usual exercises and follow other instructions from your health care provider. °· Eat and drink lightly if you think you are going into labor. °· If Braxton Hicks contractions are making you uncomfortable: °? Change your position from lying down or resting to walking, or change from walking to resting. °? Sit and rest in a tub of warm water. °? Drink enough fluid to keep your urine pale yellow. Dehydration may cause these contractions. °? Do slow and deep breathing several times an hour. °· Keep all follow-up prenatal visits as told by your health care provider. This is important. °Contact a health care provider if: °· You have a fever. °· You have continuous pain in your abdomen. °Get help right away if: °· Your contractions become stronger, more regular, and closer together. °· You have fluid leaking or gushing from your vagina. °· You pass blood-tinged mucus (bloody show). °· You have bleeding from your vagina. °· You have low back pain that you never had before. °· You feel your baby’s head pushing down and causing pelvic pressure. °· Your baby is not moving inside you as much as it used to. °Summary °· Contractions that occur before labor are   called Braxton Hicks contractions, false labor, or practice contractions. °· Braxton Hicks contractions are usually shorter, weaker, farther apart, and less regular than true labor contractions. True labor contractions usually become progressively stronger and regular, and they become more frequent. °· Manage discomfort from Braxton Hicks contractions  by changing position, resting in a warm bath, drinking plenty of water, or practicing deep breathing. °This information is not intended to replace advice given to you by your health care provider. Make sure you discuss any questions you have with your health care provider. °Document Revised: 04/02/2017 Document Reviewed: 09/03/2016 °Elsevier Patient Education © 2020 [employer] ° °

## 2019-06-21 ENCOUNTER — Telehealth (INDEPENDENT_AMBULATORY_CARE_PROVIDER_SITE_OTHER): Payer: Medicaid Other | Admitting: Obstetrics

## 2019-06-21 ENCOUNTER — Encounter: Payer: Self-pay | Admitting: Obstetrics

## 2019-06-21 DIAGNOSIS — O98813 Other maternal infectious and parasitic diseases complicating pregnancy, third trimester: Secondary | ICD-10-CM

## 2019-06-21 DIAGNOSIS — O26893 Other specified pregnancy related conditions, third trimester: Secondary | ICD-10-CM

## 2019-06-21 DIAGNOSIS — Z8759 Personal history of other complications of pregnancy, childbirth and the puerperium: Secondary | ICD-10-CM

## 2019-06-21 DIAGNOSIS — Z3A34 34 weeks gestation of pregnancy: Secondary | ICD-10-CM

## 2019-06-21 DIAGNOSIS — A749 Chlamydial infection, unspecified: Secondary | ICD-10-CM

## 2019-06-21 DIAGNOSIS — O26892 Other specified pregnancy related conditions, second trimester: Secondary | ICD-10-CM

## 2019-06-21 DIAGNOSIS — R300 Dysuria: Secondary | ICD-10-CM

## 2019-06-21 DIAGNOSIS — Z3400 Encounter for supervision of normal first pregnancy, unspecified trimester: Secondary | ICD-10-CM

## 2019-06-21 NOTE — Progress Notes (Signed)
TELEHEALTH OBSTETRICS PRENATAL VIRTUAL VIDEO VISIT ENCOUNTER NOTE  Provider location: [facility] [place]   I connected with Terrilyn Reede on 06/21/19 at 11:00 AM EST by OB MyChart Video Encounter at home and verified that I am speaking with the correct person using two identifiers.   I discussed the limitations, risks, security and privacy concerns of performing an evaluation and management service virtually and the availability of in person appointments. I also discussed with the patient that there may be a patient responsible charge related to this service. The patient expressed understanding and agreed to proceed. Subjective:  Judith Evans is a 19 y.o. G2P0010 at [redacted]w[redacted]d being seen today for ongoing prenatal care.  She is currently monitored for the following issues for this low-risk pregnancy and has Supervision of normal first pregnancy, antepartum; Dysuria during pregnancy in second trimester; Chlamydia infection affecting pregnancy; History of miscarriage; Echogenic bowel of fetus; Abnormal ultrasonic finding on antenatal screening of mother; Absence of menstruation; Cyst of right ovary; Decreased appetite; Family planning, Depo-Provera contraception monitoring/administration; Personal history of other infectious and parasitic diseases; and Primigravida, antepartum on their problem list.  Patient reports no complaints.  Contractions: Irregular. Vag. Bleeding: None.  Movement: Present. Denies any leaking of fluid.   The following portions of the patient's history were reviewed and updated as appropriate: allergies, current medications, past family history, past medical history, past social history, past surgical history and problem list.   Objective:  There were no vitals filed for this visit.  Fetal Status:     Movement: Present     General:  Alert, oriented and cooperative. Patient is in no acute distress.  Respiratory: Normal respiratory effort, no problems with  respiration noted  Mental Status: Normal mood and affect. Normal behavior. Normal judgment and thought content.  Rest of physical exam deferred due to type of encounter  Imaging: [place] MFM OB DETAIL +14 WK  Result Date: 06/09/2019 ----------------------------------------------------------------------  OBSTETRICS REPORT                       (Signed Final 06/09/2019 02:34 pm) ---------------------------------------------------------------------- Patient Info  ID #:       [id number]                          D.O.B.:  06/23/2000 (18 yrs)  Name:       Judith Evans                  Visit Date: 06/09/2019 02:05 pm ---------------------------------------------------------------------- Performed By  Performed By:     Marcellina Millin          Ref. Address:     [address] [place],  [place] [address]  Attending:        Johnell Comings MD         [facility]  Referred By:      Lezlie Lye NP ---------------------------------------------------------------------- Orders   #  Description                          Code         Ordered By   1  [place] MFM OB DETAIL +14 WK              76811.01     [facility]  ----------------------------------------------------------------------   #  Order #                    Accession #                 Episode #   1  [id number]                  [id number]                  [id number]  ---------------------------------------------------------------------- Indications   Echogenic bowel                                O35.8XX0 028.3   Encounter for antenatal screening for          Z36.3   malformations   [redacted] weeks gestation of pregnancy                Z3A.33  ---------------------------------------------------------------------- Fetal Evaluation   Num Of Fetuses:         1  Fetal Heart Rate(bpm):  144  Cardiac Activity:       Observed  Presentation:           Cephalic  Placenta:               Anterior  P. Cord Insertion:      Visualized  Amniotic Fluid  AFI FV:      Within normal limits  AFI Sum(cm)     %Tile       Largest Pocket(cm)  13.45           44          5.3  RUQ(cm)       RLQ(cm)       LUQ(cm)        LLQ(cm)  5.3           3.41          3.42           1.32 ---------------------------------------------------------------------- Biometry  BPD:      81.4  mm     G. Age:  32w 5d         35  %    CI:        73.91   %  70 - 86                                                          FL/HC:      20.6   %    19.9 - 21.5  HC:      300.7  mm     G. Age:  33w 2d         22  %    HC/AC:      1.05        0.96 - 1.11  AC:      287.2  mm     G. Age:  32w 5d         43  %    FL/BPD:     75.9   %    71 - 87  FL:       61.8  mm     G. Age:  32w 0d         16  %    FL/AC:      21.5   %    20 - 24  HUM:      54.1  mm     G. Age:  31w 3d         29  %  Est. FW:    2007  gm      4 lb 7 oz     29  % ---------------------------------------------------------------------- OB History  Gravidity:    2         Term:   0        Prem:   0        SAB:   1  TOP:          0       Ectopic:  0        Living: 0 ---------------------------------------------------------------------- Gestational Age  LMP:           33w 0d        Date:  10/21/18                 EDD:   07/28/19  U/S Today:     32w 5d                                        EDD:   07/30/19  Best:          33w 0d     Det. By:  LMP  (10/21/18)          EDD:   07/28/19 ---------------------------------------------------------------------- Anatomy  Cranium:               Appears normal         Aortic Arch:            Appears normal  Cavum:                 Appears normal         Ductal Arch:            Appears normal  Ventricles:            Appears normal         Diaphragm:  Appears normal  Choroid Plexus:         Appears normal         Stomach:                Appears normal, left                                                                        sided  Cerebellum:            Appears normal         Abdomen:                Appears normal  Posterior Fossa:       Appears normal         Abdominal Wall:         Not well visualized  Nuchal Fold:           Not applicable (>20    Cord Vessels:           Appears normal ([redacted]                         wks GA)                                        vessel cord)  Face:                  Orbits nl; profile not Kidneys:                Appear normal                         well visualized  Lips:                  Appears normal         Bladder:                Appears normal  Thoracic:              Appears normal         Spine:                  Appears normal  Heart:                 Not well visualized    Upper Extremities:      Visualized  RVOT:                  Appears normal         Lower Extremities:      Visualized  LVOT:                  Appears normal ---------------------------------------------------------------------- Cervix Uterus Adnexa  Cervix  Not visualized (advanced GA >24wks) ---------------------------------------------------------------------- Comments  This patient was seen for a detailed fetal anatomy scan as  the patient reports that echogenic bowel was noted during a  fetal anatomy scan earlier in her pregnancy.  That exam was  performed at [facility].  She reports that she underwent  a complete  work-up for the echogenic bowel including having  a cell free DNA test which indicated a low risk for trisomy 21,  18, and 13.  She denies any problems in her current  pregnancy.  She was informed that the fetal growth and amniotic fluid  level were appropriate for her gestational age.  The fetal bowel did not appear echogenic today.  The views  of the fetal anatomy were limited today due to her advanced  gestational age.  The patient was informed that anomalies may be missed  due  to technical limitations. If the fetus is in a suboptimal position  or maternal habitus is increased, visualization of the fetus in  the maternal uterus may be impaired.  Due to the association of echogenic bowel and fetal growth  restriction, a follow-up growth scan was scheduled in 4 weeks. ----------------------------------------------------------------------                   Ma Rings, MD Electronically Signed Final Report   06/09/2019 02:34 pm ----------------------------------------------------------------------   Assessment and Plan:  Pregnancy: G2P0010 at [redacted]w[redacted]d 1. Supervision of normal first pregnancy, antepartum  2. Chlamydia infection affecting pregnancy, antepartum  3. History of miscarriage  4. Dysuria during pregnancy in second trimester   Preterm labor symptoms and general obstetric precautions including but not limited to vaginal bleeding, contractions, leaking of fluid and fetal movement were reviewed in detail with the patient. I discussed the assessment and treatment plan with the patient. The patient was provided an opportunity to ask questions and all were answered. The patient agreed with the plan and demonstrated an understanding of the instructions. The patient was advised to call back or seek an in-person office evaluation/go to MAU at [facility] for any urgent or concerning symptoms. Please refer to After Visit Summary for other counseling recommendations.   I provided 10 minutes of face-to-face time during this encounter.  Return in about 2 weeks (around 07/05/2019) for ROB.  GBS.  Future Appointments  Date Time Provider Department Center  07/07/2019  2:15 PM [facility] NURSE [facility]  07/07/2019  2:15 PM [facility] [place] 4 [facility]    Coral Ceo, MD [facility], [facility] 06/21/2019

## 2019-06-24 ENCOUNTER — Inpatient Hospital Stay (HOSPITAL_COMMUNITY)
Admission: AD | Admit: 2019-06-24 | Discharge: 2019-06-24 | Disposition: A | Discharge status: Home / Self Care | Payer: Medicaid Other | Attending: Family Medicine | Admitting: Family Medicine

## 2019-06-24 ENCOUNTER — Encounter (HOSPITAL_COMMUNITY): Payer: Self-pay | Admitting: Family Medicine

## 2019-06-24 ENCOUNTER — Other Ambulatory Visit: Payer: Self-pay

## 2019-06-24 DIAGNOSIS — O26893 Other specified pregnancy related conditions, third trimester: Secondary | ICD-10-CM

## 2019-06-24 DIAGNOSIS — O4703 False labor before 37 completed weeks of gestation, third trimester: Secondary | ICD-10-CM | POA: Diagnosis not present

## 2019-06-24 DIAGNOSIS — Z3A35 35 weeks gestation of pregnancy: Secondary | ICD-10-CM | POA: Diagnosis not present

## 2019-06-24 DIAGNOSIS — O479 False labor, unspecified: Secondary | ICD-10-CM

## 2019-06-24 DIAGNOSIS — R109 Unspecified abdominal pain: Secondary | ICD-10-CM | POA: Diagnosis present

## 2019-06-24 LAB — OB RESULTS CONSOLE GC/CHLAMYDIA: Gonorrhea: NEGATIVE

## 2019-06-24 LAB — WET PREP, GENITAL
Clue Cells Wet Prep HPF POC: NONE SEEN
Sperm: NONE SEEN
Trich, Wet Prep: NONE SEEN
Yeast Wet Prep HPF POC: NONE SEEN

## 2019-06-24 LAB — URINALYSIS, ROUTINE W REFLEX MICROSCOPIC
Bilirubin Urine: NEGATIVE
Glucose, UA: NEGATIVE mg/dL
Hgb urine dipstick: NEGATIVE
Ketones, ur: NEGATIVE mg/dL
Leukocytes,Ua: NEGATIVE
Nitrite: NEGATIVE
Protein, ur: NEGATIVE mg/dL
Specific Gravity, Urine: 1.017 (ref 1.005–1.030)
pH: 7 (ref 5.0–8.0)

## 2019-06-24 MED ORDER — NIFEDIPINE 10 MG PO CAPS
10.0000 mg | ORAL_CAPSULE | Freq: Once | ORAL | Status: AC
  Administered 2019-06-24: 11:00:00 10 mg via ORAL
  Filled 2019-06-24: qty 1

## 2019-06-24 MED ORDER — ACETAMINOPHEN 500 MG PO TABS
1000.0000 mg | ORAL_TABLET | Freq: Once | ORAL | Status: AC
  Administered 2019-06-24: 11:00:00 1000 mg via ORAL
  Filled 2019-06-24: qty 2

## 2019-06-24 NOTE — MAU Note (Signed)
Judith Evans is a 19 y.o. at [redacted]w[redacted]d here in MAU reporting:  +lower abdominal and back pain Pain was initially constant but now intermittent. Onset of complaint: 430 am Pain score: 6-7/10  Denies LOF or vaginal bleeding. Patient reports that she thinks she may have lost her mucus plug yesterday  Vitals:   06/24/19 0924  BP: 127/75  Pulse: (!) 102  Resp: 16  Temp: 98.3 F (36.8 C)  SpO2: 99%    FHT:147 Lab orders placed from triage: ua

## 2019-06-24 NOTE — MAU Provider Note (Signed)
History     CSN: [id number]  Arrival date and time: 06/24/19 7628   First Provider Initiated Contact with Patient 06/24/19 1007      Chief Complaint  Patient presents with  . Abdominal Pain  . Back Pain   HPI   Ms.Judith Evans is a 19 y.o. female G2P0010 @ 68w1dhere with abdominal pain that woke her from her sleep at 0400. The pain is located in the the bottom of her abdomen and radiates to the lower back. She rates her pain 6/10. No N/V currently. She has no urinary complaints.  States she did throw up this morning. She had this same pain 2 weeks ago, however this pain is worse.   OB History    Gravida  2   Para  0   Term      Preterm      AB  1   Living  0     SAB  1   TAB      Ectopic      Multiple      Live Births  0           Past Medical History:  Diagnosis Date  . Medical history non-contributory     Past Surgical History:  Procedure Laterality Date  . NO PAST SURGERIES      History reviewed. No pertinent family history.  Social History   Tobacco Use  . Smoking status: Never Smoker  . Smokeless tobacco: Never Used  Substance Use Topics  . Alcohol use: Never  . Drug use: Never    Allergies: No Known Allergies  Medications Prior to Admission  Medication Sig Dispense Refill Last Dose  . Blood Pressure Monitoring (BLOOD PRESSURE KIT) DEVI 1 kit by Does not apply route once a week. Check Blood Pressure regularly and record readings into the [employer] App.  Large Cuff.  DX O90.0 1 each 0   . cyclobenzaprine (FLEXERIL) 10 MG tablet Take 1 tablet (10 mg total) by mouth 3 (three) times daily as needed for muscle spasms. 30 tablet 2    Results for orders placed or performed during the hospital encounter of 06/24/19 (from the past 48 hour(s))  Urinalysis, Routine w reflex microscopic     Status: Abnormal   Collection Time: 06/24/19  9:17 AM  Result Value Ref Range   Color, Urine YELLOW YELLOW   APPearance HAZY (A) CLEAR   Specific  Gravity, Urine 1.017 1.005 - 1.030   pH 7.0 5.0 - 8.0   Glucose, UA NEGATIVE NEGATIVE mg/dL   Hgb urine dipstick NEGATIVE NEGATIVE   Bilirubin Urine NEGATIVE NEGATIVE   Ketones, ur NEGATIVE NEGATIVE mg/dL   Protein, ur NEGATIVE NEGATIVE mg/dL   Nitrite NEGATIVE NEGATIVE   Leukocytes,Ua NEGATIVE NEGATIVE    Comment: Performed at [facility] [place] [id number] Wet prep, genital     Status: Abnormal   Collection Time: 06/24/19 10:25 AM  Result Value Ref Range   Yeast Wet Prep HPF POC NONE SEEN NONE SEEN   Trich, Wet Prep NONE SEEN NONE SEEN   Clue Cells Wet Prep HPF POC NONE SEEN NONE SEEN   WBC, Wet Prep HPF POC FEW (A) NONE SEEN   Sperm NONE SEEN     Comment: [facility]formed [facility] [address], [place] [id number]  Review of Systems  Constitutional: Negative for fever.  Gastrointestinal: Positive for abdominal pain.  Genitourinary: Negative for dysuria,  urgency and vaginal bleeding.   Physical Exam   Blood pressure 116/70, pulse (!) 102, temperature 98.3 F (36.8 C), temperature source Oral, resp. rate 16, last menstrual period 10/21/2018, SpO2 99 %.  Physical Exam  Constitutional: She is oriented to person, place, and time. She appears well-developed and well-nourished. No distress.  HENT:  Head: Normocephalic.  Eyes: Pupils are equal, round, and reactive to light.  GI: Soft. She exhibits no distension. There is no abdominal tenderness. There is no rebound, no guarding and no CVA tenderness.  Genitourinary:    Genitourinary Comments: Cervix: FT external, internal os closed, thick, anterior.    Musculoskeletal:        General: Normal range of motion.  Neurological: She is alert and oriented to person, place, and time.  Skin: She is not diaphoretic.   Fetal Tracing: Baseline: 145 bpm Variability: Moderate Accelerations: 15x15 Decelerations: None Toco: Occasional   MAU Course  Procedures  None  MDM  Procardia,  and tylenol given. Pain significantly improved.  Cervix FT.  UA normal.  Assessment and Plan   A:  1. Braxton Hick's contraction   2. [redacted] weeks gestation of pregnancy   3. Abdominal pain in pregnancy, third trimester     P:  Discharge home with strict return precautions F/u in the office as scheduled Return to MAU if symptoms worsen  Bladimir Auman, Artist Pais, NP 06/24/2019 12:16 PM

## 2019-06-24 NOTE — Discharge Instructions (Signed)
Abdominal Pain During Pregnancy  Belly (abdominal) pain is common during pregnancy. There are many possible causes. Most of the time, it is not a serious problem. Other times, it can be a sign that something is wrong with the pregnancy. Always tell your doctor if you have belly pain. Follow these instructions at home:  Do not have sex or put anything in your vagina until your pain goes away completely.  Get plenty of rest until your pain gets better.  Drink enough fluid to keep your pee (urine) pale yellow.  Take over-the-counter and prescription medicines only as told by your doctor.  Keep all follow-up visits as told by your doctor. This is important. Contact a doctor if:  Your pain continues or gets worse after resting.  You have lower belly pain that: ? Comes and goes at regular times. ? Spreads to your back. ? Feels like menstrual cramps.  You have pain or burning when you pee (urinate). Get help right away if:  You have a fever or chills.  You have vaginal bleeding.  You are leaking fluid from your vagina.  You are passing tissue from your vagina.  You throw up (vomit) for more than 24 hours.  You have watery poop (diarrhea) for more than 24 hours.  Your baby is moving less than usual.  You feel very weak or faint.  You have shortness of breath.  You have very bad pain in your upper belly. Summary  Belly (abdominal) pain is common during pregnancy. There are many possible causes.  If you have belly pain during pregnancy, tell your doctor right away.  Keep all follow-up visits as told by your doctor. This is important. This information is not intended to replace advice given to you by your health care provider. Make sure you discuss any questions you have with your health care provider. Document Revised: 08/08/2018 Document Reviewed: 07/23/2016 Elsevier Patient Education  2020 [employer]  

## 2019-06-26 LAB — GC/CHLAMYDIA PROBE AMP (~~LOC~~) NOT AT ARMC
Chlamydia: NEGATIVE
Comment: NEGATIVE
Comment: NORMAL
Neisseria Gonorrhea: NEGATIVE

## 2019-07-02 ENCOUNTER — Other Ambulatory Visit: Payer: Self-pay

## 2019-07-02 ENCOUNTER — Encounter (HOSPITAL_COMMUNITY): Payer: Self-pay | Admitting: Emergency Medicine

## 2019-07-02 ENCOUNTER — Emergency Department (HOSPITAL_COMMUNITY)
Admission: EM | Admit: 2019-07-02 | Discharge: 2019-07-02 | Disposition: A | Discharge status: Home / Self Care | Payer: Medicaid Other | Attending: Emergency Medicine | Admitting: Emergency Medicine

## 2019-07-02 DIAGNOSIS — O1203 Gestational edema, third trimester: Secondary | ICD-10-CM | POA: Diagnosis present

## 2019-07-02 DIAGNOSIS — M7989 Other specified soft tissue disorders: Secondary | ICD-10-CM

## 2019-07-02 NOTE — ED Triage Notes (Signed)
Pt. Stated, my swelling  7 months and keeps getting worse. I need to get my ring cut off

## 2019-07-02 NOTE — ED Provider Notes (Signed)
[facility] EMERGENCY DEPARTMENT Provider Note   CSN: [id number] Arrival date & time: 07/02/19  1033     History Chief Complaint  Patient presents with  . Joint Swelling    ring cut off    Judith Evans is a 19 y.o. female who presents for evaluation of diffuse swelling and inability to get her ring off her left fourth digit.  Patient is currently 9 months pregnant and stated that her hands and fingers started swelling diffusely.  She stated that she was having difficulty getting her rings off.  She denies any fevers, redness of the finger.  The history is provided by the patient.       Past Medical History:  Diagnosis Date  . Medical history non-contributory     Patient Active Problem List   Diagnosis Date Noted  . 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 Chlamydia infection affecting pregnancy 05/24/2019  . History of miscarriage 05/24/2019  . Dysuria during pregnancy in second trimester 03/30/2019  . Abnormal ultrasonic finding on antenatal screening of mother 02/21/2019  . Personal history of other infectious and parasitic diseases 01/03/2019  . Primigravida, antepartum 01/02/2019  . Supervision of normal first pregnancy, antepartum 12/12/2018  . Cyst of right ovary 09/15/2018  . Absence of menstruation 03/17/2016  . Family planning, Depo-Provera contraception monitoring/administration 04/24/2015  . Decreased appetite 01/18/2012    Past Surgical History:  Procedure Laterality Date  . NO PAST SURGERIES       OB History    Gravida  2   Para  0   Term      Preterm      AB  1   Living  0     SAB  1   TAB      Ectopic      Multiple      Live Births  0           No family history on file.  Social History   Tobacco Use  . Smoking status: Never Smoker  . Smokeless tobacco: Never Used  Substance Use Topics  . Alcohol use: Never  . Drug use: Never    Home Medications Prior to Admission medications   Medication Sig  Start Date End Date Taking? Authorizing Provider  Blood Pressure Monitoring (BLOOD PRESSURE KIT) DEVI 1 kit by Does not apply route once a week. Check Blood Pressure regularly and record readings into the [employer] App.  Large Cuff.  DX O90.0 05/24/19   Rasch, Anderson Malta I, NP  cyclobenzaprine (FLEXERIL) 10 MG tablet Take 1 tablet (10 mg total) by mouth 3 (three) times daily as needed for muscle spasms. 06/14/19   Darlina Rumpf, CNM    Allergies    Patient has no known allergies.  Review of Systems   Review of Systems  Constitutional: Negative for fever.  All other systems reviewed and are negative.   Physical Exam Updated Vital Signs BP 135/90 (BP Location: Right Arm)   Pulse 89   Temp 98.8 F (37.1 C) (Oral)   Resp 18   Ht 5' (1.524 m)   Wt 61.2 kg   LMP 10/21/2018 (Approximate)   SpO2 100%   BMI 26.37 kg/m   Physical Exam Vitals and nursing note reviewed.  Constitutional:      Appearance: She is well-developed.  HENT:     Head: Normocephalic and atraumatic.  Eyes:     General: No scleral icterus.       Right eye: No  discharge.        Left eye: No discharge.     Conjunctiva/sclera: Conjunctivae normal.  Pulmonary:     Effort: Pulmonary effort is normal.  Musculoskeletal:     Comments: Mild swelling noted to bilateral hands.  No overlying warmth, erythema.  Full range of motion of all 5 digits of left hand intact any difficulty.  Left fourth digit is without any erythema, warmth.  She can fully flex and extend all day.  No evidence of open wound.  Skin:    General: Skin is warm and dry.     Comments: Good distal cap refill. LUE is not dusky in appearance or cool to touch.  Neurological:     Mental Status: She is alert.  Psychiatric:        Speech: Speech normal.        Behavior: Behavior normal.     ED Results / Procedures / Treatments   Labs (all labs ordered are listed, but only abnormal results are displayed) Labs Reviewed - No data to  display  EKG None  Radiology No results found.  Procedures Procedures (including critical care time)  Medications Ordered in ED Medications - No data to display  ED Course  I have reviewed the triage vital signs and the nursing notes.  Pertinent labs & imaging results that were available during my care of the patient were reviewed by me and considered in my medical decision making (see chart for details).    MDM Rules/Calculators/A&P                      19 year old female who presents for evaluation of swelling to her left fourth digit.  Patient reports she has had difficulty getting her ring off of the finger secondary to swelling.  No fevers, color change.  On initial ED arrival, she is afebrile, nontoxic-appearing.  Vital signs are stable.  Ring was removed by nurse.  Reevaluation after ring removal shows no evidence of infectious etiology.  Swelling is consistent with pregnancy.  No evidence of flexor tenosynovitis, cellulitis.  Encouraged at home supportive care measures. At this time, patient exhibits no emergent life-threatening condition that require further evaluation in ED or admission. Patient had ample opportunity for questions and discussion. All patient's questions were answered with full understanding. Strict return precautions discussed. Patient expresses understanding and agreement to plan.   Portions of this note were generated with [employer]. Dictation errors may occur despite best attempts at proofreading.  Final Clinical Impression(s) / ED Diagnoses Final diagnoses:  Finger swelling    Rx / DC Orders ED Discharge Orders    None       Desma Mcgregor 07/02/19 1554    Dorie Rank, MD 07/03/19 1017

## 2019-07-02 NOTE — ED Notes (Signed)
Ring removed from L 4th digit with wound cleanser.

## 2019-07-02 NOTE — Discharge Instructions (Signed)
Do not wear any jewelry until you give birth and the swelling goes down.  Return emergency department for any worsening concerns or symptoms.

## 2019-07-05 ENCOUNTER — Other Ambulatory Visit (HOSPITAL_COMMUNITY)
Admission: RE | Admit: 2019-07-05 | Discharge: 2019-07-05 | Disposition: A | Discharge status: Home / Self Care | Payer: Medicaid Other | Source: Ambulatory Visit | Attending: Nurse Practitioner | Admitting: Nurse Practitioner

## 2019-07-05 ENCOUNTER — Encounter: Payer: Self-pay | Admitting: Nurse Practitioner

## 2019-07-05 ENCOUNTER — Other Ambulatory Visit: Payer: Self-pay

## 2019-07-05 ENCOUNTER — Ambulatory Visit (INDEPENDENT_AMBULATORY_CARE_PROVIDER_SITE_OTHER): Payer: Medicaid Other | Admitting: Nurse Practitioner

## 2019-07-05 VITALS — BP 132/80 | HR 86 | Wt 138.0 lb

## 2019-07-05 DIAGNOSIS — Z3400 Encounter for supervision of normal first pregnancy, unspecified trimester: Secondary | ICD-10-CM | POA: Insufficient documentation

## 2019-07-05 DIAGNOSIS — Z3A36 36 weeks gestation of pregnancy: Secondary | ICD-10-CM

## 2019-07-05 MED ORDER — VITAFOL GUMMIES 3.33-0.333-34.8 MG PO CHEW: 3.0000 | CHEWABLE_TABLET | Freq: Every day | ORAL | 11 refills | Status: DC

## 2019-07-05 NOTE — Addendum Note (Signed)
Addended by: Thom Chimes on: 07/05/2019 01:07 PM   Modules accepted: Orders

## 2019-07-05 NOTE — Patient Instructions (Addendum)
AREA PEDIATRIC/FAMILY PRACTICE PHYSICIANS  Central/Southeast [place] ([id number]) . [facility] Melodie Bouillon, MD; Lum Babe, MD; Sheffield Slider, MD; Leveda Anna, MD; McDiarmid, MD; Jerene Bears, MD; Jennette Kettle, MD; Gwendolyn Grant, MD o [address] [place], [place] [address] o [phone] o Mon-Fri 8:30-12:30, 1:30-5:00 o Providers come to see babies at [facility] o Accepting Medicaid . [facility] o Limited providers who accept newborns: Docia Chuck, MD; Kateri Plummer, MD; Paulino Rily, MD o [address], [place], [place] [address] o [phone] o Mon-Fri 8:00-5:30 o Babies seen by providers at [facility] o Does NOT accept Medicaid o Please call early in hospitalization for appointment (limited availability)  . [facility] Fatima Sanger, MD o [address] [place], [place] [address] o [phone] o Mon, Tue, Thur, Fri 8:30-5:00, Wed 10:00-7:00 (closed 1-2pm) o Babies seen by [facility] providers o Accepting Medicaid . Donnie Coffin - Pediatrician Fae Pippin, MD o [address], [place], [place] [address] o [phone] o Mon-Fri 8:30-5:00, Sat 8:30-12:00 o Provider comes to see babies at [facility] o Accepting Medicaid o Must have been referred from current patients or contacted office prior to delivery . [facility]) Leotis Pain, MD; Ave Filter, MD; Luna Fuse, MD; Kennedy Bucker, MD; Konrad Dolores, MD; Kathlene November, MD; Jenne Campus, MD; Lubertha South, MD; Wynetta Emery, MD; Duffy Rhody, MD; Gerre Couch, NP; Shirl Harris, NP o [address] [place], [place], [place] [address] o [phone] o Mon, Tue, Thur, Fri 8:30-5:30, Wed 9:30-5:30, Sat 8:30-12:30 o Babies seen by [facility] providers o Accepting Medicaid o Only accepting infants of first-time parents or siblings of current patients [facility] discharge coordinator will make follow-up appointment . Cyril Mourning o [address], [place], [place]  [address] o [phone]   Fax - [phone] . [facility] o [address], [place], [place]  [address] o Phone - [phone]   Fax - [phone] . Lucio Edward o [address], [place], [place]  [address] o [phone]  [place] [phone]) . [facility] Jorge Mandril, MD; Alita Chyle, MD; Princella Ion, MD; MD; Earlene Plater, MD; Jamesetta Orleans, MD; Alvera Novel, MD; Clarene Duke, MD; Rana Snare, MD; Carmon Ginsberg, MD; Alinda Money, MD; Hosie Poisson, MD; Mayford Knife, MD o [address] [place], [place] [address] o [phone] o Mon-Fri 8:30-5:00 (extended evenings Mon-Thur as needed), Sat-Sun 10:00-1:00 o Providers come to see babies at [facility] o Accepting Medicaid for families of first-time babies and families with all children in the household age 79 and under. Must register with office prior to making appointment (M-F only). [facility] Odella Aquas, NP; Lynelle Doctor, MD; Susann Givens, MD; [place], [place] o [address], [place], [place] [address] o [phone] o Mon-Fri 8:00-5:00 o Babies seen by providers at [facility] o Does NOT accept Medicaid/Commercial Insurance Only . [facility] [place] [facility]  Suzette Battiest, MD; Zachery Dauer, MD; Stefan Church, MD; Sabino Dick, MD; Quitman Livings, MD; Farris Has, MD; Gaynell Face, MD; Betha Loa, MD; Colon Flattery, MD; Clifton James, MD o [address] [place] [place], [place] [address] o [phone] o Mon-Fri 8:30-5:30, Sat (Oct.-Mar.) 9:00-1:00 o Babies seen by providers at [facility] o Accepting [facility] [phone]) . [facility] Dimitri, MD; Sheliah Hatch, MD o [address], [place], [place] [address] o [phone] o Mon-Fri 8:30-5:00, Sat 8:30-12:00 o Providers come to see babies at [facility] o Does NOT accept Medicaid . [facility] o Becker, PA; Hagler, MD; Scifres, PA; Sun, MD; Swayne, MD o [address],  [place], [place] [address] o [phone] o Mon-Fri 8:00-5:00 o Babies seen by providers at [facility] o Does NOT accept Medicaid o Only accepting babies of parents who are patients o Please call early in hospitalization for appointment (limited availability) . [facility] o Clark, MD; Frye, MD; Kelleher, MD; Mack, NP; Miller, MD; O'Keller, MD; Patterson, NP; Pudlo, MD; Puzio, MD; Thomas, MD; Tucker, MD; Twiselton, MD o [address], [place], [place] [address] o [phone] o Mon-Fri 8:00-5:00, Sat 9:00-12:00 o Providers come to see babies at [facility] o Does NOT accept Medicaid  [place] ([id number]) . [facility] o Limited providers accepting new patients: Brake, NP; Wharton, PA o [address], [place], [place] [address] o [phone] o Mon-Fri 8:00-5:00 o Babies seen by providers at [facility] o Does NOT accept Medicaid o Only accepting babies of parents who are patients o Please call early in hospitalization for appointment (limited availability) . [facility] o Gay, MD; Quinlan, MD o [address] [place], [place] [address] o [phone] (press 1 to schedule appointment) o Mon-Fri 8:00-5:00 o Providers come to see babies at [facility] o Does NOT accept Medicaid . [facility] o Mazer, MD o [address] [place], [place] [address] o [phone] o Mon-Fri 8:30-5:00 (lunch 12:30-1:00), extended hours by appointment only Wed 5:00-6:30 o Babies seen by [facility] providers o Accepting Medicaid . [facility] o Banks, MD; Jordan, MD; Koberlein, MD o [address], [place], [place] [address] o [phone] o Mon-Fri 8:00-5:00 o Babies seen by [facility] providers o Does NOT accept Medicaid . [facility] o Parker, MD; Hunter, MD; Wallace, DO o [address] [place], [place]  [address] o [phone] o Mon-Fri 8:00-5:00 o Babies seen by [facility] providers o Does NOT accept Medicaid . [facility] o Brandon, PA; Brecken, PA; Christy, NP; Dees, MD; DeClaire, MD; DeWeese, MD; Hansen, NP; Mills, NP; Parrish, NP; Smoot, NP; Summer, MD; Vapne, MD o [address] [place], [place] [address] o [phone] o Mon-Fri 8:30-5:00, Sat 10:00-1:00 o Providers come to see babies at [facility] o Does NOT accept Medicaid o Free prenatal information session Tuesdays at 4:45pm . [facility] o Bouska, MD; Gordon, PA; Jeffery, PA; Weber, PA o [address] [place] [address] o [phone] o Mon-Fri 7:30-5:30 o Babies seen by [facility] providers . [facility] o [address], [place], [place], [place]  [address] o [phone]   Fax - [phone]  [place] ([phone]) . [facility] [place] o [address] [place], [place] [address] o [phone] o Mon-Thur 8:00-6:00 o Providers come to see babies at [facility] o Accepting Medicaid . [facility] o Anderson, NP; Badger, MD; Beal, PA; Spencer, PA o [address] [place],  [address] o [phone] o Mon-Thur 7:30-7:30, Fri 7:30-4:30 o Babies seen by [facility] providers o Accepting Medicaid . [facility] o Agbuya, MD; Klett, NP; Romgoolam, MD o [address], ,  [address] o [phone] o Mon-Fri 8:30-5:00, Sat 8:30-12:00 o Providers come to see babies at [facility] o Accepting Medicaid o Must have "Meet & Greet" appointment at office prior to delivery . [facility]  [facility] ) o McCord,   MD; Juleen China, MD; Clydene Laming, [place], [place], [place] [address] o [phone] o Mon-Wed 8:00-6:00, Thur-Fri 8:00-5:00, Sat 9:00-12:00 o Providers come to  see babies at [facility] o Does NOT accept Medicaid o Only accepting siblings of current patients . [facility] [place], [place], [place], [place]  [address] o [phone]   Fax [phone] . [place] [facility] [place] [address], [place], [place]  [address] o [phone]   Fax - Morton Gorman [phone] & [phone]) . Therapist, music at [place], DO; [place], [address] [place], [place] [address] o [phone] o Mon-Fri 7:00-5:00 o Babies seen by [facility] providers o Does NOT accept Medicaid . [place], MD; [place], [place]; [place], [place], [place], [place] [address] o [phone] o Mon-Fri 8:00-5:00 o Babies seen by [facility] providers o Accepting Medicaid . [place], MD; [place], [place]; [place], NP; [place], [place], [place], [place] [address] o [phone] o Mon-Fri 8:00-5:00 o Babies seen by providers at [place] [place] [phone]) . [facility], [place] o [address] [place], [place] [address] o [phone] o Mon-Fri 8:00-5:00 o Babies seen by [facility] providers o Does NOT accept Medicaid o Limited availability, please call early in hospitalization to schedule follow-up . [facility] Leilani Merl, PA; Maisie Fus, MD; [place], MD; [place], [place]; Jeannine Kitten, MD; [place], [place] [facility] [address], [place], [place] [address] o [phone] o Mon-Fri 8:30-5:00, Sat 9:00-12:00 o Babies seen by providers at [facility] o Accepting Medicaid o Please register online then schedule online or call office o [email or web address] . [place] ([place]  [place]) Kristian Covey, NP; Dwyane Dee, MD; Leonidas Romberg, PA o [address] [place], [place], [place] [address] o [phone] o Mon-Fri 8:00-5:00 o Babies seen by providers at [place] o Accepting Medicaid . [place] ([facility]) Dairl Ponder, MD; Rayvon Char, NP; Melina Modena, MD o [address] [place], [place], [place] [address] o [phone] o Mon-Fri 8:00-5:30, Sat&Sun by appointment (phones open at 8:30) o Babies seen by [facility] providers o Accepting Medicaid o Must be a first-time baby or sibling of current patient . [place], [address], [place], [place]  [address] o [phone]   Fax - [phone]  [place] [phone] & [phone]) . [place], [place]; [place], [place]; Benjamine Mola, MD; [place], [place]; Harrell Lark, MD o [address] [place], [place] [address] o [phone] o Mon-Thur 8:00-7:00, Fri 8:00-5:00, Sat 8:00-12:00, Sun 9:00-12:00 o Babies seen by [facility] providers o Accepting Medicaid . [facility] at [facility], MD; Ruthann Cancer, MD; [facility], MD o [address], [place], [place] [address] o [phone] o Mon-Thur 8:00-5:00 o Babies seen by providers at [facility] o Accepting Medicaid . [facility] [place], MD; Coe[place], MD; Amedeo Plenty, MD; Bobby Rumpf, MD; List, MD; Lavonia Drafts, MD; Ruthann Cancer, MD; Selinda Eon, MD; Audie Box, MD; Jim Like, MD; Christie [place], MD; Hubbard Hartshorn, MD; Modena Nunnery, MD o [address] [place], [place]  [address] o [phone] o Mon-Fri 8:00-5:30, Sat (Oct.-Mar.) 9:00-1:00 o Babies seen by providers at [facility] o Accepting Medicaid o Must fill out new patient packet, available online at [email or web address] . [place] ([facility]) Barnabas Lister, NP; [place] Kingfisher, NP; Claiborne Billings, NP; Rolla Plate, MD;  [place], [place]; [place]ola Rhine, MD; Tyron Russell, MD; Delia Chimes, NP o [address], [place], [place] [address] o [phone] o Mon-Thur 8:00-5:30, Fri 8:00-5:00 o Babies seen by providers at [place] [phone]) . [place], [place]; [place], MD; Dennard Schaumann, MD; [place], [place] o [address] [place], [place] [address] o [phone] o Mon-Fri 8:00-5:00 o Babies seen by providers at [place] [phone]) . [place], [place]; Olen Pel, MD; [place], [place], [place], [place] [address] o [phone] o Mon-Fri 8:00-5:00 o Babies seen by providers at [facility] o Does NOT accept Medicaid o Limited appointment availability, please call early in hospitalization  . Therapist, music at [place], [place]; [place], [address], [place], [place] [address] o [phone] o Mon-Fri 8:00-5:00 o Babies seen by [facility] providers o Does NOT accept Medicaid . [facility] Su Grand, MD; Guy Sandifer, MD; [place], [place]; [place], [place] [address], [place], [place] [address] o [phone] o Mon-Fri 8:00-5:00 o After hours clinic [facility] [place], [place] [address]) [phone] Mon-Fri 5:00-8:00, Sat 12:00-6:00, Sun 10:00-4:00 o Babies seen by [facility] providers o Accepting Medicaid . [place] [facility] o [address], [place], [place]  [address] o [phone]   Fax - [phone]  [place] [phone]) . Therapist, music at [facility], MD o [address] [place], [place], [place] [address] o [phone] o Mon-Fri 8:00-5:00 o Babies seen by [facility] providers o Does NOT accept Medicaid . [place] ([facility] [place]) Bing Neighbors, MD o [address] [place], [place],   [address] o [phone] o Mon-Thur 8:00-7:00, Fri 8:00-5:00, Sat 8:00-12:00 o Babies seen by providers at [facility] o Accepting Medicaid - but does not have vaccinations in office (must be received elsewhere) o Limited availability, please call early in hospitalization  [place] ([id number]) . [place], MD o [address] [place] [address] o [phone]  Fax [phone]    Signs and Symptoms of Labor Labor is your body's natural process of moving your baby, placenta, and umbilical cord out of your uterus. The process of labor usually starts when your baby is full-term, between 52 and 40 weeks of pregnancy. How will I know when I am close to going into labor? As your body prepares for labor and the birth of your baby, you may notice the following symptoms in the weeks and days before true labor starts:  Having a strong desire to get your home ready to receive your new baby. This is called nesting. Nesting may be a sign that labor is approaching, and it may occur several weeks before birth. Nesting may involve cleaning and organizing your home.  Passing a small amount of thick, bloody mucus out of your vagina (normal bloody show or losing your mucus plug). This may happen more than a week before labor begins, or it might occur right  before labor begins as the opening of the cervix starts to widen (dilate). For some women, the entire mucus plug passes at once. For others, smaller portions of the mucus plug may gradually pass over several days.  Your baby moving (dropping) lower in your pelvis to get into position for birth (lightening). When this happens, you may feel more pressure on your bladder and pelvic bone and less pressure on your ribs. This may make it easier to breathe. It may also cause you to need to urinate more often and have problems with bowel movements.  Having "practice contractions" (Braxton Hicks contractions) that occur at  irregular (unevenly spaced) intervals that are more than 10 minutes apart. This is also called false labor. False labor contractions are common after exercise or sexual activity, and they will stop if you change position, rest, or drink fluids. These contractions are usually mild and do not get stronger over time. They may feel like: ? A backache or back pain. ? Mild cramps, similar to menstrual cramps. ? Tightening or pressure in your abdomen. Other early symptoms that labor may be starting soon include:  Nausea or loss of appetite.  Diarrhea.  Having a sudden burst of energy, or feeling very tired.  Mood changes.  Having trouble sleeping. How will I know when labor has begun? Signs that true labor has begun may include:  Having contractions that come at regular (evenly spaced) intervals and increase in intensity. This may feel like more intense tightening or pressure in your abdomen that moves to your back. ? Contractions may also feel like rhythmic pain in your upper thighs or back that comes and goes at regular intervals. ? For first-time mothers, this change in intensity of contractions often occurs at a more gradual pace. ? Women who have given birth before may notice a more rapid progression of contraction changes.  Having a feeling of pressure in the vaginal area.  Your water breaking (rupture of membranes). This is when the sac of fluid that surrounds your baby breaks. When this happens, you will notice fluid leaking from your vagina. This may be clear or blood-tinged. Labor usually starts within 24 hours of your water breaking, but it may take longer to begin. ? Some women notice this as a gush of fluid. ? Others notice that their underwear repeatedly becomes damp. Follow these instructions at home:   When labor starts, or if your water breaks, call your health care provider or nurse care line. Based on your situation, they will determine when you should go in for an  exam.  When you are in early labor, you may be able to rest and manage symptoms at home. Some strategies to try at home include: ? Breathing and relaxation techniques. ? Taking a warm bath or shower. ? Listening to music. ? Using a heating pad on the lower back for pain. If you are directed to use heat:  Place a towel between your skin and the heat source.  Leave the heat on for 20-30 minutes.  Remove the heat if your skin turns bright red. This is especially important if you are unable to feel pain, heat, or cold. You may have a greater risk of getting burned. Get help right away if:  You have painful, regular contractions that are 5 minutes apart or less.  Labor starts before you are [redacted] weeks along in your pregnancy.  You have a fever.  You have a headache that does not go away.  You   have bright red blood coming from your vagina.  You do not feel your baby moving.  You have a sudden onset of: ? Severe headache with vision problems. ? Nausea, vomiting, or diarrhea. ? Chest pain or shortness of breath. These symptoms may be an emergency. If your health care provider recommends that you go to the hospital or birth center where you plan to deliver, do not drive yourself. Have someone else drive you, or call emergency services (911 in the [place] Summary  Labor is your body's natural process of moving your baby, placenta, and umbilical cord out of your uterus.  The process of labor usually starts when your baby is full-term, between 23 and 40 weeks of pregnancy.  When labor starts, or if your water breaks, call your health care provider or nurse care line. Based on your situation, they will determine when you should go in for an exam. This information is not intended to replace advice given to you by your health care provider. Make sure you discuss any questions you have with your health care provider. Document Revised: 01/18/2017 Document Reviewed: 09/25/2016 Elsevier Patient  Education  2020 [employer].

## 2019-07-05 NOTE — Progress Notes (Signed)
    Subjective:  Judith Evans is a 19 y.o. G2P0010 at [redacted]w[redacted]d being seen today for ongoing prenatal care.  She is currently monitored for the following issues for this low-risk pregnancy and has Supervision of normal first pregnancy, antepartum; Dysuria during pregnancy in second trimester; Chlamydia infection affecting pregnancy; History of miscarriage; Echogenic bowel of fetus; Abnormal ultrasonic finding on antenatal screening of mother; Absence of menstruation; Cyst of right ovary; Decreased appetite; Personal history of other infectious and parasitic diseases; and Primigravida, antepartum on their problem list. Early prenatal care was at [facility] [place]. Reviewed note where she had Ring removed from finger due to edema.  Patient reports occasional contractions.  Contractions: Irregular. Vag. Bleeding: None.  Movement: Present. Denies leaking of fluid.   The following portions of the patient's history were reviewed and updated as appropriate: allergies, current medications, past family history, past medical history, past social history, past surgical history and problem list. Problem list updated.  Objective:   Vitals:   07/05/19 1133  BP: 132/80  Pulse: 86  Weight: 138 lb (62.6 kg)    Fetal Status: Fetal Heart Rate (bpm): 148 Fundal Height: 36 cm Movement: Present  Presentation: Vertex  General:  Alert, oriented and cooperative. Patient is in no acute distress.  Skin: Skin is warm and dry. No rash noted.   Cardiovascular: Normal heart rate noted  Respiratory: Normal respiratory effort, no problems with respiration noted  Abdomen: Soft, gravid, appropriate for gestational age. Pain/Pressure: Present     Pelvic:  Cervical exam performed Dilation: Closed   Station: Ballotable  Extremities: Normal range of motion.  Edema: Trace  Mental Status: Normal mood and affect. Normal behavior. Normal judgment and thought content.   Urinalysis:      Assessment and Plan:  Pregnancy:  G2P0010 at [redacted]w[redacted]d  1. Supervision of normal first pregnancy, antepartum Doing well. Advised to try PNV again - did not do well in early pregnancy and she did not take again. Having some contractions from time to time but she is timing and paying attention.  They may last for several hours, but then they stop. No edema in ankles, some in hands. Reviewed signs of increasing BP - headaches, visual changes, RUQ pain and client denies any of these.  Knows to seek additional care if these occur. Baby is moving well.  Preterm labor symptoms and general obstetric precautions including but not limited to vaginal bleeding, contractions, leaking of fluid and fetal movement were reviewed in detail with the patient. Please refer to After Visit Summary for other counseling recommendations.  Return in about 1 week (around 07/12/2019) for in person ROB.  Nolene Bernheim, RN, MSN, NP-BC Nurse Practitioner, [facility] for [employer], [facility] 07/05/2019 12:02 PM

## 2019-07-06 LAB — CERVICOVAGINAL ANCILLARY ONLY
Bacterial Vaginitis (gardnerella): NEGATIVE
Candida Glabrata: NEGATIVE
Candida Vaginitis: POSITIVE — AB
Chlamydia: NEGATIVE
Comment: NEGATIVE
Comment: NEGATIVE
Comment: NEGATIVE
Comment: NEGATIVE
Comment: NEGATIVE
Comment: NORMAL
Neisseria Gonorrhea: NEGATIVE
Trichomonas: NEGATIVE

## 2019-07-06 MED ORDER — TERCONAZOLE 0.4 % VA CREA: 1.0000 | TOPICAL_CREAM | Freq: Every day | VAGINAL | 1 refills | Status: DC

## 2019-07-06 NOTE — Addendum Note (Signed)
Addended by: Currie Paris on: 07/06/2019 10:34 PM   Modules accepted: Orders

## 2019-07-07 ENCOUNTER — Ambulatory Visit (HOSPITAL_COMMUNITY): Payer: Medicaid Other | Admitting: *Deleted

## 2019-07-07 ENCOUNTER — Ambulatory Visit (HOSPITAL_COMMUNITY)
Admission: RE | Admit: 2019-07-07 | Discharge: 2019-07-07 | Disposition: A | Discharge status: Home / Self Care | Payer: Medicaid Other | Source: Ambulatory Visit | Attending: Obstetrics and Gynecology | Admitting: Obstetrics and Gynecology

## 2019-07-07 ENCOUNTER — Other Ambulatory Visit: Payer: Self-pay

## 2019-07-07 ENCOUNTER — Encounter (HOSPITAL_COMMUNITY): Payer: Self-pay

## 2019-07-07 DIAGNOSIS — Z362 Encounter for other antenatal screening follow-up: Secondary | ICD-10-CM

## 2019-07-07 DIAGNOSIS — Z3400 Encounter for supervision of normal first pregnancy, unspecified trimester: Secondary | ICD-10-CM | POA: Diagnosis present

## 2019-07-07 DIAGNOSIS — A749 Chlamydial infection, unspecified: Secondary | ICD-10-CM

## 2019-07-07 DIAGNOSIS — Z8759 Personal history of other complications of pregnancy, childbirth and the puerperium: Secondary | ICD-10-CM

## 2019-07-07 DIAGNOSIS — O26892 Other specified pregnancy related conditions, second trimester: Secondary | ICD-10-CM | POA: Insufficient documentation

## 2019-07-07 DIAGNOSIS — O359XX0 Maternal care for (suspected) fetal abnormality and damage, unspecified, not applicable or unspecified: Secondary | ICD-10-CM

## 2019-07-07 DIAGNOSIS — O98819 Other maternal infectious and parasitic diseases complicating pregnancy, unspecified trimester: Secondary | ICD-10-CM | POA: Diagnosis present

## 2019-07-07 DIAGNOSIS — Z3A37 37 weeks gestation of pregnancy: Secondary | ICD-10-CM | POA: Diagnosis not present

## 2019-07-07 DIAGNOSIS — R300 Dysuria: Secondary | ICD-10-CM | POA: Diagnosis present

## 2019-07-08 ENCOUNTER — Encounter (HOSPITAL_COMMUNITY): Payer: Self-pay | Admitting: Obstetrics & Gynecology

## 2019-07-08 ENCOUNTER — Inpatient Hospital Stay (HOSPITAL_COMMUNITY)
Admission: AD | Admit: 2019-07-08 | Discharge: 2019-07-08 | Disposition: A | Discharge status: Home / Self Care | Payer: Medicaid Other | Attending: Obstetrics & Gynecology | Admitting: Obstetrics & Gynecology

## 2019-07-08 DIAGNOSIS — R519 Headache, unspecified: Secondary | ICD-10-CM | POA: Insufficient documentation

## 2019-07-08 DIAGNOSIS — G8929 Other chronic pain: Secondary | ICD-10-CM

## 2019-07-08 DIAGNOSIS — Z3A37 37 weeks gestation of pregnancy: Secondary | ICD-10-CM | POA: Diagnosis not present

## 2019-07-08 DIAGNOSIS — Z3689 Encounter for other specified antenatal screening: Secondary | ICD-10-CM

## 2019-07-08 DIAGNOSIS — O26893 Other specified pregnancy related conditions, third trimester: Secondary | ICD-10-CM | POA: Insufficient documentation

## 2019-07-08 MED ORDER — BUTALBITAL-APAP-CAFFEINE 50-325-40 MG PO TABS
2.0000 | ORAL_TABLET | Freq: Once | ORAL | Status: AC
  Administered 2019-07-08: 2 via ORAL
  Filled 2019-07-08: qty 2

## 2019-07-08 MED ORDER — BUTALBITAL-APAP-CAFFEINE 50-325-40 MG PO TABS: 2.0000 | ORAL_TABLET | Freq: Four times a day (QID) | ORAL | Status: DC | PRN

## 2019-07-08 MED ORDER — BUTALBITAL-APAP-CAFFEINE 50-325-40 MG PO TABS: 1.0000 | ORAL_TABLET | Freq: Four times a day (QID) | ORAL | 0 refills | Status: DC | PRN

## 2019-07-08 NOTE — Discharge Instructions (Signed)

## 2019-07-08 NOTE — MAU Note (Signed)
Pt presents to MAU c/o a HA that started on Wednesday morning. Pt states it eased off and since then it has progressively gotten worse. Tylenol is not helping. Pt denies blurry vision or changes in her vision. No RUQ pain. +FM. Pt reports some back and lower abdominal pain that was earlier today none now. No bleeding or LOF.

## 2019-07-08 NOTE — MAU Provider Note (Signed)
History     CSN: [id number]  Arrival date and time: 07/08/19 0003   First Provider Initiated Contact with Patient 07/08/19 0045      Chief Complaint  Patient presents with  . Headache   Judith Evans is a 19 y.o. G2P0010 at 54w1dwho receives care at [facility].  She presents today for Headache that started Wednesday.  She states it has gotten worse today and has not improved with two doses of tylenol.  She states she was seen on Wednesday and her blood pressure was elevated.  She reports taking one extra strength tylenol around 6 or 7 pm.  Patient states the headache is located on the right side and she describes it as "pounding."  She states it has been constant and not worsened by any known factors.  Patient denies light sensitivity and vomiting, but endorses some nausea earlier today.  Patient endorses fetal movement and denies vaginal concerns including leaking, bleeding, and discharge.       OB History    Gravida  2   Para  0   Term      Preterm      AB  1   Living  0     SAB  1   TAB      Ectopic      Multiple      Live Births  0           Past Medical History:  Diagnosis Date  . Medical history non-contributory     Past Surgical History:  Procedure Laterality Date  . NO PAST SURGERIES      History reviewed. No pertinent family history.  Social History   Tobacco Use  . Smoking status: Never Smoker  . Smokeless tobacco: Never Used  Substance Use Topics  . Alcohol use: Never  . Drug use: Never    Allergies: No Known Allergies  Medications Prior to Admission  Medication Sig Dispense Refill Last Dose  . acetaminophen (TYLENOL) 500 MG tablet Take 500 mg by mouth every 6 (six) hours as needed.   07/07/2019 at Unknown time  . Blood Pressure Monitoring (BLOOD PRESSURE KIT) DEVI 1 kit by Does not apply route once a week. Check Blood Pressure regularly and record readings into the [employer] App.  Large Cuff.  DX O90.0 1 each 0   .  cyclobenzaprine (FLEXERIL) 10 MG tablet Take 1 tablet (10 mg total) by mouth 3 (three) times daily as needed for muscle spasms. (Patient not taking: Reported on 07/07/2019) 30 tablet 2   . Prenatal Vit-Fe Phos-FA-Omega (VITAFOL GUMMIES) 3.33-0.333-34.8 MG CHEW Chew 3 each by mouth daily. (Patient not taking: Reported on 07/07/2019) 90 tablet 11   . terconazole (TERAZOL 7) 0.4 % vaginal cream Place 1 applicator vaginally at bedtime for 7 days. (Patient not taking: Reported on 07/07/2019) 45 g 1     Review of Systems  Constitutional: Negative for chills and fever.  Eyes: Negative for photophobia and visual disturbance.  Respiratory: Negative for cough and stridor.   Gastrointestinal: Positive for abdominal pain and nausea. Negative for constipation, diarrhea and vomiting.  Genitourinary: Negative for difficulty urinating, dysuria, pelvic pain, vaginal bleeding and vaginal discharge.  Musculoskeletal: Positive for back pain.  Neurological: Positive for headaches. Negative for dizziness and light-headedness.   Physical Exam   Blood pressure 128/69, pulse 100, temperature 98.2 F (36.8 C), weight 62.3 kg, last menstrual period 10/21/2018.  Physical Exam  Constitutional: She is oriented to person, place, and time. She  appears well-developed and well-nourished. No distress.  HENT:  Head: Normocephalic and atraumatic.  Eyes: Conjunctivae are normal.  Cardiovascular: Normal rate, regular rhythm and normal heart sounds.  Respiratory: Effort normal and breath sounds normal. No respiratory distress.  GI: Soft.  Musculoskeletal:        General: No edema. Normal range of motion.     Cervical back: Normal range of motion.  Neurological: She is alert and oriented to person, place, and time.  Skin: Skin is warm and dry.  Psychiatric: She has a normal mood and affect. Her behavior is normal.    Fetal Assessment 135 bpm, Mod Var, -Decels, +Accels Toco: None graphed  MAU Course  No results found for  this or any previous visit (from the past 24 hour(s)). [place] MFM OB FOLLOW UP  Result Date: 07/07/2019 ----------------------------------------------------------------------  OBSTETRICS REPORT                       (Signed Final 07/07/2019 04:10 pm) ---------------------------------------------------------------------- Patient Info  ID #:       [id number]                          D.O.B.:  03/02/2001 (18 yrs)  Name:       Judith Evans                  Visit Date: 07/07/2019 02:36 pm ---------------------------------------------------------------------- Performed By  Performed By:     Rodrigo Ran BS      Ref. Address:     [address]                    [facility]                                                             [place],                                                             [place] [address]  Attending:        Johnell Comings MD         [facility]  Referred By:      Lezlie Lye NP ---------------------------------------------------------------------- Orders   #  Description                          Code         Ordered By   1  [place] MFM OB FOLLOW UP  44967.59     Peterson Ao  ----------------------------------------------------------------------   #  Order #                    Accession #                 Episode #   1  [id number]                  [id number]                  [id number]  ---------------------------------------------------------------------- Indications   Fetal abnormality - other known or             O35.9XX0   suspected (echogenic bowel)   [redacted] weeks gestation of pregnancy                Z3A.37   Antenatal follow-up for nonvisualized fetal    Z36.2   anatomy  ---------------------------------------------------------------------- Fetal Evaluation  Num Of Fetuses:         1  Fetal Heart Rate(bpm):  129  Cardiac Activity:       Observed  Presentation:            Cephalic  Placenta:               Anterior  P. Cord Insertion:      Previously Visualized  Amniotic Fluid  AFI FV:      Within normal limits  AFI Sum(cm)     %Tile       Largest Pocket(cm)  10.41           27          5.08  RUQ(cm)       RLQ(cm)       LUQ(cm)        LLQ(cm)  5.08          2.49          0.96           1.88 ---------------------------------------------------------------------- Biometry  BPD:      86.9  mm     G. Age:  35w 1d         15  %    CI:        74.65   %    70 - 86                                                          FL/HC:      21.2   %    20.8 - 22.6  HC:      319.2  mm     G. Age:  36w 0d          8  %    HC/AC:      1.02        0.92 - 1.05  AC:      314.4  mm     G. Age:  35w 3d         19  %    FL/BPD:     77.9   %    71 - 87  FL:       67.7  mm     G. Age:  34w 6d  6  %    FL/AC:      21.5   %    20 - 24  HUM:      58.7  mm     G. Age:  34w 0d         13  %  Est. FW:    2630  gm    5 lb 13 oz      15  % ---------------------------------------------------------------------- OB History  Gravidity:    2         Term:   0        Prem:   0        SAB:   1  TOP:          0       Ectopic:  0        Living: 0 ---------------------------------------------------------------------- Gestational Age  LMP:           37w 0d        Date:  10/21/18                 EDD:   07/28/19  U/S Today:     35w 3d                                        EDD:   08/08/19  Best:          37w 0d     Det. By:  LMP  (10/21/18)          EDD:   07/28/19 ---------------------------------------------------------------------- Anatomy  Cranium:               Appears normal         Aortic Arch:            Previously seen  Cavum:                 Appears normal         Ductal Arch:            Previously seen  Ventricles:            Appears normal         Diaphragm:              Appears normal  Choroid Plexus:        Appears normal         Stomach:                Appears normal, left                                                                         sided  Cerebellum:            Previously seen        Abdomen:                Appears normal  Posterior Fossa:       Previously seen        Abdominal Wall:         Not well visualized  Nuchal Fold:  Not applicable (>34    Cord Vessels:           Previously seen                         wks GA)  Face:                  Orbits nl; profile not Kidneys:                Appear normal                         well visualized  Lips:                  Appears normal         Bladder:                Appears normal  Thoracic:              Appears normal         Spine:                  Previously seen  Heart:                 Appears normal         Upper Extremities:      Previously seen                         (4CH, axis, and                         situs)  RVOT:                  Appears normal         Lower Extremities:      Previously seen  LVOT:                  Appears normal ---------------------------------------------------------------------- Cervix Uterus Adnexa  Cervix  Not visualized (advanced GA >24wks)  Uterus  No abnormality visualized.  Left Ovary  Not visualized.  Right Ovary  Not visualized.  Cul De Sac  No free fluid seen.  Adnexa  No abnormality visualized. ---------------------------------------------------------------------- Comments  This patient was seen for a follow up growth scan due to  echogenic bowel that was noted during her prior ultrasound  exams.  She denies any problems since her last exam  She was informed that the fetal growth and amniotic fluid  level appears appropriate for her gestational age.  The fetal bowel did not appear echogenic today.  Follow-up as indicated. ----------------------------------------------------------------------                   Johnell Comings, MD Electronically Signed Final Report   07/07/2019 04:10 pm ----------------------------------------------------------------------   MDM PE Labs:None EFM Pain Medication Assessment and  Plan  19 year old G2P0010  SIUP at 37.1weeks Cat I FT Headache  -POC reviewed. -Exam performed. -Will give fiorcet now and reassess. -NST reactive.   Maryann Conners MSN, CNM 07/08/2019, 12:45 AM   Reassessment (2:03 AM) -Patient reports HA now 4/10 and "is easing up." -Will send script for fiorcet to pharmacy. -Patient reports appt on Wednesday and instructed to keep as scheduled. -Encouraged to call or return to MAU if symptoms worsen or with the onset of new symptoms. -Discharged to home in improved  condition.  Maryann Conners MSN, CNM Advanced Practice Provider, [facility]

## 2019-07-09 LAB — CULTURE, BETA STREP (GROUP B ONLY): Strep Gp B Culture: NEGATIVE

## 2019-07-11 ENCOUNTER — Other Ambulatory Visit: Payer: Self-pay

## 2019-07-11 ENCOUNTER — Ambulatory Visit (INDEPENDENT_AMBULATORY_CARE_PROVIDER_SITE_OTHER): Payer: Medicaid Other | Admitting: Obstetrics & Gynecology

## 2019-07-11 ENCOUNTER — Encounter: Payer: Self-pay | Admitting: Obstetrics & Gynecology

## 2019-07-11 VITALS — BP 130/84 | HR 80 | Wt 140.6 lb

## 2019-07-11 DIAGNOSIS — O26893 Other specified pregnancy related conditions, third trimester: Secondary | ICD-10-CM

## 2019-07-11 DIAGNOSIS — Z3400 Encounter for supervision of normal first pregnancy, unspecified trimester: Secondary | ICD-10-CM

## 2019-07-11 DIAGNOSIS — Z3A37 37 weeks gestation of pregnancy: Secondary | ICD-10-CM

## 2019-07-11 DIAGNOSIS — R519 Headache, unspecified: Secondary | ICD-10-CM

## 2019-07-11 MED ORDER — CYCLOBENZAPRINE HCL 5 MG PO TABS: 5.0000 mg | ORAL_TABLET | Freq: Three times a day (TID) | ORAL | 0 refills | Status: AC | PRN

## 2019-07-11 NOTE — Progress Notes (Signed)
   PRENATAL VISIT NOTE  Subjective:  Judith Evans is a 19 y.o. G2P0010 at [redacted]w[redacted]d being seen today for ongoing prenatal care.  She is currently monitored for the following issues for this high-risk pregnancy and has Supervision of normal first pregnancy, antepartum; Dysuria during pregnancy in second trimester; Chlamydia infection affecting pregnancy; History of miscarriage; Echogenic bowel of fetus; Abnormal ultrasonic finding on antenatal screening of mother; Absence of menstruation; Cyst of right ovary; Decreased appetite; Personal history of other infectious and parasitic diseases; and Primigravida, antepartum on their problem list.  Patient reports backache, fatigue, headache and she was seen 3/6 for HA, Fioricet 1 tab doesn't help much, not sleeping well.  Contractions: Not present.  .  Movement: Present. Denies leaking of fluid.   The following portions of the patient's history were reviewed and updated as appropriate: allergies, current medications, past family history, past medical history, past social history, past surgical history and problem list.   Objective:   Vitals:   07/11/19 0903  BP: 130/84  Pulse: 80  Weight: 140 lb 9.6 oz (63.8 kg)    Fetal Status: Fetal Heart Rate (bpm): 140   Movement: Present     General:  Alert, oriented and cooperative. Patient is in no acute distress.  Skin: Skin is warm and dry. No rash noted.   Cardiovascular: Normal heart rate noted  Respiratory: Normal respiratory effort, no problems with respiration noted  Abdomen: Soft, gravid, appropriate for gestational age.  Pain/Pressure: Absent     Pelvic: Cervical exam deferred        Extremities: Normal range of motion.  Edema: Trace  Mental Status: Normal mood and affect. Normal behavior. Normal judgment and thought content.   Assessment and Plan:  Pregnancy: G2P0010 at [redacted]w[redacted]d 1. Supervision of normal first pregnancy, antepartum PIH labs - CBC - Comprehensive metabolic panel; Take 1 tablet (5  mg total) by mouth every 8 (eight) hours as needed (headache backache).  Dispense: 10 tablet; Refill: 0  2. Headache in pregnancy, antepartum, third trimester Fioricet as prescribed, Try Flexeril - cyclobenzaprine (FLEXERIL) 5 MG tablet; Take 1 tablet (5 mg total) by mouth every 8 (eight) hours as needed (headache backache).  Dispense: 10 tablet; Refill: 0  Term labor symptoms and general obstetric precautions including but not limited to vaginal bleeding, contractions, leaking of fluid and fetal movement were reviewed in detail with the patient. Please refer to After Visit Summary for other counseling recommendations.   Return in about 2 days (around 07/13/2019).Evaluate sx and labs  No future appointments.  Scheryl Darter, MD

## 2019-07-11 NOTE — Patient Instructions (Signed)

## 2019-07-11 NOTE — Progress Notes (Signed)
ROB   C/O : HA's , Blurred vision went to MAU 07/08/19.

## 2019-07-12 ENCOUNTER — Encounter: Payer: Medicaid Other | Admitting: Family Medicine

## 2019-07-13 ENCOUNTER — Encounter: Payer: Self-pay | Admitting: Obstetrics and Gynecology

## 2019-07-13 ENCOUNTER — Inpatient Hospital Stay (HOSPITAL_COMMUNITY)
Admission: AD | Admit: 2019-07-13 | Discharge: 2019-07-16 | DRG: 807 | Disposition: A | Discharge status: Home / Self Care | Payer: Medicaid Other | Attending: Obstetrics & Gynecology | Admitting: Obstetrics & Gynecology

## 2019-07-13 ENCOUNTER — Encounter (HOSPITAL_COMMUNITY): Payer: Self-pay | Admitting: Obstetrics and Gynecology

## 2019-07-13 ENCOUNTER — Other Ambulatory Visit: Payer: Self-pay

## 2019-07-13 ENCOUNTER — Ambulatory Visit (INDEPENDENT_AMBULATORY_CARE_PROVIDER_SITE_OTHER): Payer: Medicaid Other | Admitting: Obstetrics and Gynecology

## 2019-07-13 ENCOUNTER — Encounter: Payer: Medicaid Other | Admitting: Obstetrics and Gynecology

## 2019-07-13 VITALS — BP 132/80 | HR 101 | Wt 139.2 lb

## 2019-07-13 DIAGNOSIS — R300 Dysuria: Secondary | ICD-10-CM

## 2019-07-13 DIAGNOSIS — Z3A37 37 weeks gestation of pregnancy: Secondary | ICD-10-CM | POA: Diagnosis not present

## 2019-07-13 DIAGNOSIS — Z30017 Encounter for initial prescription of implantable subdermal contraceptive: Secondary | ICD-10-CM | POA: Diagnosis not present

## 2019-07-13 DIAGNOSIS — O1404 Mild to moderate pre-eclampsia, complicating childbirth: Secondary | ICD-10-CM | POA: Diagnosis present

## 2019-07-13 DIAGNOSIS — Z3490 Encounter for supervision of normal pregnancy, unspecified, unspecified trimester: Secondary | ICD-10-CM | POA: Diagnosis present

## 2019-07-13 DIAGNOSIS — O133 Gestational [pregnancy-induced] hypertension without significant proteinuria, third trimester: Secondary | ICD-10-CM | POA: Diagnosis not present

## 2019-07-13 DIAGNOSIS — Z20822 Contact with and (suspected) exposure to covid-19: Secondary | ICD-10-CM | POA: Diagnosis present

## 2019-07-13 DIAGNOSIS — O283 Abnormal ultrasonic finding on antenatal screening of mother: Secondary | ICD-10-CM | POA: Diagnosis present

## 2019-07-13 DIAGNOSIS — O3483 Maternal care for other abnormalities of pelvic organs, third trimester: Secondary | ICD-10-CM | POA: Diagnosis present

## 2019-07-13 DIAGNOSIS — A749 Chlamydial infection, unspecified: Secondary | ICD-10-CM

## 2019-07-13 DIAGNOSIS — D649 Anemia, unspecified: Secondary | ICD-10-CM | POA: Diagnosis present

## 2019-07-13 DIAGNOSIS — Z8759 Personal history of other complications of pregnancy, childbirth and the puerperium: Secondary | ICD-10-CM

## 2019-07-13 DIAGNOSIS — IMO0002 Reserved for concepts with insufficient information to code with codable children: Secondary | ICD-10-CM | POA: Diagnosis present

## 2019-07-13 DIAGNOSIS — O26892 Other specified pregnancy related conditions, second trimester: Secondary | ICD-10-CM

## 2019-07-13 DIAGNOSIS — O9902 Anemia complicating childbirth: Secondary | ICD-10-CM | POA: Diagnosis present

## 2019-07-13 DIAGNOSIS — N83201 Unspecified ovarian cyst, right side: Secondary | ICD-10-CM | POA: Diagnosis present

## 2019-07-13 DIAGNOSIS — Z3403 Encounter for supervision of normal first pregnancy, third trimester: Secondary | ICD-10-CM

## 2019-07-13 DIAGNOSIS — Z3400 Encounter for supervision of normal first pregnancy, unspecified trimester: Secondary | ICD-10-CM

## 2019-07-13 DIAGNOSIS — O1490 Unspecified pre-eclampsia, unspecified trimester: Secondary | ICD-10-CM

## 2019-07-13 DIAGNOSIS — O134 Gestational [pregnancy-induced] hypertension without significant proteinuria, complicating childbirth: Secondary | ICD-10-CM | POA: Diagnosis present

## 2019-07-13 DIAGNOSIS — O358XX0 Maternal care for other (suspected) fetal abnormality and damage, not applicable or unspecified: Secondary | ICD-10-CM | POA: Diagnosis present

## 2019-07-13 LAB — COMPREHENSIVE METABOLIC PANEL
ALT: 8 IU/L (ref 0–32)
ALT: 11 U/L (ref 0–44)
AST: 18 IU/L (ref 0–40)
AST: 25 U/L (ref 15–41)
Albumin/Globulin Ratio: 1.4 (ref 1.2–2.2)
Albumin: 3.6 g/dL — ABNORMAL LOW (ref 3.9–5.0)
Albumin: 3 g/dL — ABNORMAL LOW (ref 3.5–5.0)
Alkaline Phosphatase: 186 IU/L — ABNORMAL HIGH (ref 39–117)
Alkaline Phosphatase: 164 U/L — ABNORMAL HIGH (ref 38–126)
Anion gap: 11 (ref 5–15)
BUN/Creatinine Ratio: 8 — ABNORMAL LOW (ref 9–23)
BUN: 8 mg/dL (ref 6–20)
BUN: 8 mg/dL (ref 6–20)
Bilirubin Total: 0.3 mg/dL (ref 0.0–1.2)
CO2: 17 mmol/L — ABNORMAL LOW (ref 20–29)
CO2: 18 mmol/L — ABNORMAL LOW (ref 22–32)
Calcium: 8.9 mg/dL (ref 8.7–10.2)
Calcium: 8.5 mg/dL — ABNORMAL LOW (ref 8.9–10.3)
Chloride: 108 mmol/L — ABNORMAL HIGH (ref 96–106)
Chloride: 108 mmol/L (ref 98–111)
Creatinine, Ser: 1.05 mg/dL — ABNORMAL HIGH (ref 0.57–1.00)
Creatinine, Ser: 0.97 mg/dL (ref 0.44–1.00)
GFR calc Af Amer: 88 mL/min/{1.73_m2} (ref >59)
GFR calc Af Amer: >60 mL/min (ref >60)
GFR calc non Af Amer: 77 mL/min/{1.73_m2} (ref >59)
GFR calc non Af Amer: >60 mL/min (ref >60)
Globulin, Total: 2.6 g/dL (ref 1.5–4.5)
Glucose, Bld: 96 mg/dL (ref 70–99)
Glucose: 87 mg/dL (ref 65–99)
Potassium: 4.3 mmol/L (ref 3.5–5.2)
Potassium: 4.3 mmol/L (ref 3.5–5.1)
Sodium: 137 mmol/L (ref 134–144)
Sodium: 137 mmol/L (ref 135–145)
Total Bilirubin: 0.8 mg/dL (ref 0.3–1.2)
Total Protein: 6.2 g/dL (ref 6.0–8.5)
Total Protein: 6.4 g/dL — ABNORMAL LOW (ref 6.5–8.1)

## 2019-07-13 LAB — CBC
HCT: 34.3 % — ABNORMAL LOW (ref 36.0–46.0)
Hematocrit: 34.4 % (ref 34.0–46.6)
Hemoglobin: 11.6 g/dL (ref 11.1–15.9)
Hemoglobin: 11.2 g/dL — ABNORMAL LOW (ref 12.0–15.0)
MCH: 31.4 pg (ref 26.6–33.0)
MCH: 30.5 pg (ref 26.0–34.0)
MCHC: 33.7 g/dL (ref 31.5–35.7)
MCHC: 32.7 g/dL (ref 30.0–36.0)
MCV: 93 fL (ref 79–97)
MCV: 93.5 fL (ref 80.0–100.0)
Platelets: 232 10*3/uL (ref 150–450)
Platelets: 220 10*3/uL (ref 150–400)
RBC: 3.69 x10E6/uL — ABNORMAL LOW (ref 3.77–5.28)
RBC: 3.67 MIL/uL — ABNORMAL LOW (ref 3.87–5.11)
RDW: 11.8 % (ref 11.7–15.4)
RDW: 12.6 % (ref 11.5–15.5)
WBC: 11.6 10*3/uL — ABNORMAL HIGH (ref 3.4–10.8)
WBC: 11.1 10*3/uL — ABNORMAL HIGH (ref 4.0–10.5)
nRBC: 0 % (ref 0.0–0.2)

## 2019-07-13 LAB — PROTEIN / CREATININE RATIO, URINE
Creatinine, Urine: 142.6 mg/dL
Creatinine, Urine: 34.17 mg/dL
Protein, Ur: 16.9 mg/dL
Protein/Creat Ratio: 119 mg/g creat (ref 0–200)
Total Protein, Urine: <6 mg/dL

## 2019-07-13 LAB — SARS CORONAVIRUS 2 (TAT 6-24 HRS): SARS Coronavirus 2: NEGATIVE

## 2019-07-13 MED ORDER — BUTALBITAL-APAP-CAFFEINE 50-325-40 MG PO TABS: 2.0000 | ORAL_TABLET | Freq: Once | ORAL | Status: DC

## 2019-07-13 MED ORDER — ACETAMINOPHEN 500 MG PO TABS
1000.0000 mg | ORAL_TABLET | Freq: Once | ORAL | Status: DC
  Filled 2019-07-13: qty 2

## 2019-07-13 MED ORDER — LACTATED RINGERS IV SOLN: 500.0000 mL | INTRAVENOUS | Status: DC | PRN

## 2019-07-13 MED ORDER — OXYTOCIN BOLUS FROM INFUSION: 500.0000 mL | Freq: Once | INTRAVENOUS | Status: DC

## 2019-07-13 MED ORDER — OXYTOCIN 40 UNITS IN NORMAL SALINE INFUSION - SIMPLE MED: 2.5000 [IU]/h | INTRAVENOUS | Status: DC

## 2019-07-13 MED ORDER — ACETAMINOPHEN 325 MG PO TABS: 650.0000 mg | ORAL_TABLET | ORAL | Status: DC | PRN

## 2019-07-13 MED ORDER — LACTATED RINGERS IV SOLN
INTRAVENOUS | Status: DC
  Administered 2019-07-14: 12:00:00 1 mL via INTRAVENOUS

## 2019-07-13 MED ORDER — ONDANSETRON HCL 4 MG/2ML IJ SOLN: 4.0000 mg | Freq: Four times a day (QID) | INTRAMUSCULAR | Status: DC | PRN

## 2019-07-13 MED ORDER — SOD CITRATE-CITRIC ACID 500-334 MG/5ML PO SOLN: 30.0000 mL | ORAL | Status: DC | PRN

## 2019-07-13 MED ORDER — FLEET ENEMA 7-19 GM/118ML RE ENEM: 1.0000 | ENEMA | RECTAL | Status: DC | PRN

## 2019-07-13 MED ORDER — MISOPROSTOL 50MCG HALF TABLET
50.0000 ug | ORAL_TABLET | ORAL | Status: DC
  Administered 2019-07-13 (×2): 50 ug via BUCCAL
  Filled 2019-07-13 (×2): qty 1

## 2019-07-13 MED ORDER — OXYCODONE-ACETAMINOPHEN 5-325 MG PO TABS: 2.0000 | ORAL_TABLET | ORAL | Status: DC | PRN

## 2019-07-13 MED ORDER — OXYCODONE-ACETAMINOPHEN 5-325 MG PO TABS: 1.0000 | ORAL_TABLET | ORAL | Status: DC | PRN

## 2019-07-13 MED ORDER — LIDOCAINE HCL (PF) 1 % IJ SOLN: 30.0000 mL | INTRAMUSCULAR | Status: DC | PRN

## 2019-07-13 NOTE — Progress Notes (Signed)
   PRENATAL VISIT NOTE  Subjective:  Judith Evans is a 19 y.o. G2P0010 at [redacted]w[redacted]d being seen today for ongoing prenatal care.  She is currently monitored for the following issues for this low-risk pregnancy and has Supervision of normal first pregnancy, antepartum; Dysuria during pregnancy in second trimester; Chlamydia infection affecting pregnancy; History of miscarriage; Echogenic bowel of fetus; Abnormal ultrasonic finding on antenatal screening of mother; Absence of menstruation; Cyst of right ovary; Decreased appetite; Personal history of other infectious and parasitic diseases; and Primigravida, antepartum on their problem list.  Patient reports headache.  Contractions: Not present. Vag. Bleeding: None.  Movement: Present. Denies leaking of fluid.   The following portions of the patient's history were reviewed and updated as appropriate: allergies, current medications, past family history, past medical history, past social history, past surgical history and problem list.   Objective:   Vitals:   07/13/19 1413 07/13/19 1416  BP: (!) 145/84 132/80  Pulse: (!) 102 (!) 101  Weight: 139 lb 3.2 oz (63.1 kg)     Fetal Status: Fetal Heart Rate (bpm): 135 Fundal Height: 36 cm Movement: Present     General:  Alert, oriented and cooperative. Patient is in no acute distress.  Skin: Skin is warm and dry. No rash noted.   Cardiovascular: Normal heart rate noted  Respiratory: Normal respiratory effort, no problems with respiration noted  Abdomen: Soft, gravid, appropriate for gestational age.  Pain/Pressure: Present     Pelvic: Cervical exam deferred        Extremities: Normal range of motion.  Edema: Trace  Mental Status: Normal mood and affect. Normal behavior. Normal judgment and thought content.   Assessment and Plan:  Pregnancy: G2P0010 at [redacted]w[redacted]d 1. Supervision of normal first pregnancy, antepartum Patient with persistent HA despite Fioricet. BP stable over the past few visits.  07/11/19  labs reviewed and all normal with the exception of Cr. 1.05 (up from 0.57 in 11/2018) Based on above findings, patient sent to L&D for induction of labor. Dr. Alysia Penna informed  Term labor symptoms and general obstetric precautions including but not limited to vaginal bleeding, contractions, leaking of fluid and fetal movement were reviewed in detail with the patient. Please refer to After Visit Summary for other counseling recommendations.   Return in about 6 weeks (around 08/24/2019) for postpartum.  No future appointments.  Catalina Antigua, MD

## 2019-07-13 NOTE — Progress Notes (Signed)
Pt is here for f/u for headaches throughout her entire pregnancy. Fiorcet rx prescribed and labs drawn on 07/11/19. Pt reports that medication is helping her headaches, but headache returns as soon as it is time for another dose.

## 2019-07-13 NOTE — Progress Notes (Signed)
LABOR PROGRESS NOTE  Judith Evans is a 19 y.o. G2P0010 at [redacted]w[redacted]d  admitted for IOL gHTN.  Subjective: Not feeling contractions Reports HA has resolved since arrival Denies chest pain, SOB, RUQ pain, LE edema  Objective: BP 118/63   Pulse 89   Temp 98.8 F (37.1 C) (Oral)   Resp 16   LMP 10/21/2018 (Approximate)  or  Vitals:   07/13/19 1859 07/13/19 1920 07/13/19 2008 07/13/19 2104  BP: 114/74  123/76 118/63  Pulse: 89  (!) 104 89  Resp: 18  15 16   Temp:  98.8 F (37.1 C)    TempSrc:  Oral       Dilation: Fingertip Effacement (%): Thick Station: -3 Presentation: Vertex Exam by:: [email or web address], RN FHT: baseline rate 150, moderate varibility, +acel, -decel Toco: irregular, at times q5 min  Labs: Lab Results  Component Value Date   WBC 11.1 (H) 07/13/2019   HGB 11.2 (L) 07/13/2019   HCT 34.3 (L) 07/13/2019   MCV 93.5 07/13/2019   PLT 220 07/13/2019    Patient Active Problem List   Diagnosis Date Noted  . Encounter for induction of labor 07/13/2019  . 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 Chlamydia infection affecting pregnancy 05/24/2019  . History of miscarriage 05/24/2019  . Dysuria during pregnancy in second trimester 03/30/2019  . Abnormal ultrasonic finding on antenatal screening of mother 02/21/2019  . Personal history of other infectious and parasitic diseases 01/03/2019  . Primigravida, antepartum 01/02/2019  . Supervision of normal first pregnancy, antepartum 12/12/2018  . Cyst of right ovary 09/15/2018  . Absence of menstruation 03/17/2016  . Decreased appetite 01/18/2012    Assessment / Plan: 19 y.o. G2P0010 at [redacted]w[redacted]d here for IOL gHTN.  Labor: still closed on exam, give miso #2, hopefully FB at next exam Fetal Wellbeing:  Cat I Pain Control:  IV pain meds PRN, epidural upon request GBS: neg Anticipated MOD:  SVD  gHTN: normotensive over past several hours, HA present on admission now resolve and otherwise asymptomatic. Labs notable only  for a baseline Cr of 0.97, pending clinical course consider recheck in next 24h to ensure not rising.   [redacted]w[redacted]d, MD/MPH OB Fellow  07/13/2019, 9:37 PM

## 2019-07-13 NOTE — H&P (Addendum)
OBSTETRIC ADMISSION HISTORY AND PHYSICAL  Judith Evans is a 19 y.o. female G2P0010 with IUP at 60w6dby LMP presenting for gHTN with HA. She reports +FMs, No LOF, no VB, no blurry vision, peripheral edema, or RUQ pain.  She complains of headache earlier today that persisted despite Fioricet, she states it still comes and goes and is located in the front left temple area. She plans on bottle feeding. She request nexplanon for birth control. [facility] --->  Estimated Date of Delivery: 07/28/19  Sono:    @[redacted]w[redacted]d , CWD, normal anatomy, cephalic presentation, 25110Y 15% EFW   Prenatal History/Complications: Hx of miscarriage @ 8wks Echogenic bowel of fetus Cyst of right ovary  Chlamydia infection affecting pregnancy  Past Medical History: Past Medical History:  Diagnosis Date  . Medical history non-contributory     Past Surgical History: Past Surgical History:  Procedure Laterality Date  . NO PAST SURGERIES      Obstetrical History: OB History    Gravida  2   Para  0   Term      Preterm      AB  1   Living  0     SAB  1   TAB      Ectopic      Multiple      Live Births  0           Social History Social History   Socioeconomic History  . Marital status: Single    Spouse name: Not on file  . Number of children: Not on file  . Years of education: 157 . Highest education level: High school graduate  Occupational History  . Not on file  Tobacco Use  . Smoking status: Never Smoker  . Smokeless tobacco: Never Used  Substance and Sexual Activity  . Alcohol use: Never  . Drug use: Never  . Sexual activity: Yes    Birth control/protection: None  Other Topics Concern  . Not on file  Social History Narrative  . Not on file   Social Determinants of Health   Financial Resource Strain: Low Risk   . Difficulty of Paying Living Expenses: Not very hard  Food Insecurity: No Food Insecurity  . Worried  About RCharity fundraiserin the Last Year: Never true  . Ran Out of Food in the Last Year: Never true  Transportation Needs: No Transportation Needs  . Lack of Transportation (Medical): No  . Lack of Transportation (Non-Medical): No  Physical Activity: Inactive  . Days of Exercise per Week: 0 days  . Minutes of Exercise per Session: 0 min  Stress: No Stress Concern Present  . Feeling of Stress : Only a little  Social Connections: Moderately Isolated  . Frequency of Communication with Friends and Family: More than three times a week  . Frequency of Social Gatherings with Friends and Family: Three times a week  . Attends Religious Services: Never  . Active Member of Clubs or Organizations: No  . Attends CArchivistMeetings: Never  . Marital Status: Never married    Family History: History reviewed. No pertinent family history.  Allergies: No Known Allergies  Medications Prior to Admission  Medication Sig Dispense Refill Last Dose  . acetaminophen (TYLENOL) 500 MG tablet Take 500 mg by mouth every 6 (six) hours as needed for mild pain or headache.    Past Week at Unknown time  . butalbital-acetaminophen-caffeine (FIORICET)  50-325-40 MG tablet Take 1-2 tablets by mouth every 6 (six) hours as needed for headache. 10 tablet 0 07/12/2019 at Unknown time  . cyclobenzaprine (FLEXERIL) 5 MG tablet Take 1 tablet (5 mg total) by mouth every 8 (eight) hours as needed (headache backache). 10 tablet 0 Past Week at Unknown time  . Blood Pressure Monitoring (BLOOD PRESSURE KIT) DEVI 1 kit by Does not apply route once a week. Check Blood Pressure regularly and record readings into the Babyscripts App.  Large Cuff.  DX O90.0 1 each 0      Review of Systems   All systems reviewed and negative except as stated in HPI  Blood pressure (!) 138/98, pulse (!) 119, temperature 98.3 F (36.8 C), resp. rate 16, last menstrual period 10/21/2018. General appearance: alert, cooperative and no  distress Lungs: clear to auscultation bilaterally Heart: regular rate and rhythm Abdomen: soft, non-tender; bowel sounds normal Pelvic: Deferred Extremities: Homans sign is negative, no sign of DVT Fetal monitoringBaseline: 140 bpm, Variability: Good {> 6 bpm) and Accelerations: Reactive Dilation: Closed Effacement (%): Thick Station: -3 Exam by:: Judith Lewandowsky RN   Prenatal labs: ABO, Rh: --/--/O POS (03/11 1548) Antibody: NEG (03/11 1548) Rubella:   RPR: Non Reactive (07/28 1336)  HBsAg:    HIV: Non Reactive (12/15 0000)  GBS: Negative/-- (03/03 0110)  1 hr Glucola 116 Genetic screening NIPS normal girl Anatomy US - echogenic bowel seen on one scan, not seen on most recent scan  Prenatal Transfer Tool  Maternal Diabetes: No Genetic Screening: Normal Maternal Ultrasounds/Referrals: Echogenic bowel, not seen on repeat Fetal Ultrasounds or other Referrals:  None Maternal Substance Abuse:  No Significant Maternal Medications:  None Significant Maternal Lab Results: Group B Strep negative  Results for orders placed or performed during the hospital encounter of 07/13/19 (from the past 24 hour(s))  Type and screen [place]   Collection Time: 07/13/19  3:48 PM  Result Value Ref Range   ABO/RH(D) O POS    Antibody Screen NEG    Sample Expiration      07/16/2019,2359 Performed at [facility], [place] [address], [place], [place] [address]     Patient Active Problem List   Diagnosis Date Noted  . Encounter for induction of labor 07/13/2019  . 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 Chlamydia infection affecting pregnancy 05/24/2019  . History of miscarriage 05/24/2019  . Dysuria during pregnancy in second trimester 03/30/2019  . Abnormal ultrasonic finding on antenatal screening of mother 02/21/2019  . Personal history of other infectious and parasitic diseases 01/03/2019  . Primigravida, antepartum 01/02/2019  . Supervision of normal first  pregnancy, antepartum 12/12/2018  . Cyst of right ovary 09/15/2018  . Absence of menstruation 03/17/2016  . Decreased appetite 01/18/2012    Assessment/Plan:  Judith Evans is a 19 y.o. G2P0010 at 1w6dhere for IOL for gHTN v. Pre-ecclampsia- suspected to be with severe features (HA).  #Labor: Will start with cytotec. Consider FB when appropriate. #Pain: Does not want epidural, says she will likely want IV pain medications. #ID: GBS neg. #MOF: Bottle #MOC:Nexplanon #Circ:  N/A #gHTN v Pre-E: Pre-E labs pending. No severe range blood pressures. Consider magnesium if headache does not improve with tylenol. #Headache: Will give tylenol, if headache persists and labs support, it will start magnesium.   RLurline Del DO  07/13/2019, 4:47 PM  GME ATTESTATION:  I saw and evaluated the patient. I agree with the findings and the plan of care  as documented in the resident's note with addition of the following:  Risks and benefits of induction were reviewed, including failure of method, prolonged labor, need for further intervention, risk of cesarean.  Patient and family seem to understand these risks and wish to proceed. Options of cytotec, foley bulb, AROM, and pitocin reviewed, with use of each discussed.  Merilyn Baba, DO OB Fellow, [place] for [place] 07/13/2019 5:22 PM

## 2019-07-14 ENCOUNTER — Encounter (HOSPITAL_COMMUNITY): Payer: Self-pay | Admitting: Obstetrics and Gynecology

## 2019-07-14 DIAGNOSIS — O133 Gestational [pregnancy-induced] hypertension without significant proteinuria, third trimester: Secondary | ICD-10-CM

## 2019-07-14 DIAGNOSIS — Z30017 Encounter for initial prescription of implantable subdermal contraceptive: Secondary | ICD-10-CM

## 2019-07-14 DIAGNOSIS — Z3A37 37 weeks gestation of pregnancy: Secondary | ICD-10-CM

## 2019-07-14 LAB — RPR: RPR Ser Ql: NONREACTIVE

## 2019-07-14 LAB — CBC
HCT: 25.2 % — ABNORMAL LOW (ref 36.0–46.0)
Hemoglobin: 8 g/dL — ABNORMAL LOW (ref 12.0–15.0)
MCH: 30.3 pg (ref 26.0–34.0)
MCHC: 31.7 g/dL (ref 30.0–36.0)
MCV: 95.5 fL (ref 80.0–100.0)
Platelets: 216 10*3/uL (ref 150–400)
RBC: 2.64 MIL/uL — ABNORMAL LOW (ref 3.87–5.11)
RDW: 12.8 % (ref 11.5–15.5)
WBC: 26.5 10*3/uL — ABNORMAL HIGH (ref 4.0–10.5)
nRBC: 0 % (ref 0.0–0.2)

## 2019-07-14 LAB — DIC (DISSEMINATED INTRAVASCULAR COAGULATION)PANEL
D-Dimer, Quant: 1.97 ug/mL-FEU — ABNORMAL HIGH (ref 0.00–0.50)
Fibrinogen: 319 mg/dL (ref 210–475)
INR: 1.2 (ref 0.8–1.2)
Platelets: 220 10*3/uL (ref 150–400)
Prothrombin Time: 14.6 seconds (ref 11.4–15.2)
Smear Review: NONE SEEN
aPTT: 25 seconds (ref 24–36)

## 2019-07-14 MED ORDER — DIBUCAINE (PERIANAL) 1 % EX OINT: 1.0000 "application " | TOPICAL_OINTMENT | CUTANEOUS | Status: DC | PRN

## 2019-07-14 MED ORDER — MISOPROSTOL 200 MCG PO TABS
600.0000 ug | ORAL_TABLET | Freq: Once | ORAL | Status: AC
  Administered 2019-07-14: 12:00:00 600 ug via BUCCAL

## 2019-07-14 MED ORDER — TRANEXAMIC ACID-NACL 1000-0.7 MG/100ML-% IV SOLN
1000.0000 mg | INTRAVENOUS | Status: AC
  Administered 2019-07-14: 1000 mg via INTRAVENOUS
  Filled 2019-07-14: qty 100

## 2019-07-14 MED ORDER — ACETAMINOPHEN 325 MG PO TABS
650.0000 mg | ORAL_TABLET | ORAL | Status: DC | PRN
  Administered 2019-07-14: 16:00:00 650 mg via ORAL
  Filled 2019-07-14: qty 2

## 2019-07-14 MED ORDER — CEFAZOLIN (ANCEF) 1 G IV SOLR: 1.0000 g | INTRAVENOUS | Status: DC

## 2019-07-14 MED ORDER — ONDANSETRON HCL 4 MG/2ML IJ SOLN: 4.0000 mg | INTRAMUSCULAR | Status: DC | PRN

## 2019-07-14 MED ORDER — COCONUT OIL OIL: 1.0000 "application " | TOPICAL_OIL | Status: DC | PRN

## 2019-07-14 MED ORDER — TETANUS-DIPHTH-ACELL PERTUSSIS 5-2.5-18.5 LF-MCG/0.5 IM SUSP: 0.5000 mL | Freq: Once | INTRAMUSCULAR | Status: DC

## 2019-07-14 MED ORDER — TRANEXAMIC ACID-NACL 1000-0.7 MG/100ML-% IV SOLN: 1000.0000 mg | INTRAVENOUS | Status: AC

## 2019-07-14 MED ORDER — TERBUTALINE SULFATE 1 MG/ML IJ SOLN: 0.2500 mg | Freq: Once | INTRAMUSCULAR | Status: DC | PRN

## 2019-07-14 MED ORDER — LIDOCAINE HCL 1 % IJ SOLN
0.0000 mL | Freq: Once | INTRAMUSCULAR | Status: AC | PRN
  Administered 2019-07-14: 20 mL via INTRADERMAL
  Filled 2019-07-14: qty 20

## 2019-07-14 MED ORDER — LACTATED RINGERS IV SOLN: INTRAVENOUS | Status: DC

## 2019-07-14 MED ORDER — FENTANYL CITRATE (PF) 100 MCG/2ML IJ SOLN
100.0000 ug | INTRAMUSCULAR | Status: DC | PRN
  Administered 2019-07-14 (×3): 100 ug via INTRAVENOUS
  Filled 2019-07-14 (×3): qty 2

## 2019-07-14 MED ORDER — CARBOPROST TROMETHAMINE 250 MCG/ML IM SOLN: 250.0000 ug | Freq: Once | INTRAMUSCULAR | Status: DC

## 2019-07-14 MED ORDER — SENNOSIDES-DOCUSATE SODIUM 8.6-50 MG PO TABS
2.0000 | ORAL_TABLET | ORAL | Status: DC
  Administered 2019-07-14 – 2019-07-15 (×2): 2 via ORAL
  Filled 2019-07-14 (×2): qty 2

## 2019-07-14 MED ORDER — BENZOCAINE-MENTHOL 20-0.5 % EX AERO
1.0000 "application " | INHALATION_SPRAY | CUTANEOUS | Status: DC | PRN
  Administered 2019-07-14: 1 via TOPICAL
  Filled 2019-07-14: qty 56

## 2019-07-14 MED ORDER — ETONOGESTREL 68 MG ~~LOC~~ IMPL
68.0000 mg | DRUG_IMPLANT | Freq: Once | SUBCUTANEOUS | Status: AC
  Administered 2019-07-14: 68 mg via SUBCUTANEOUS
  Filled 2019-07-14: qty 1

## 2019-07-14 MED ORDER — TRANEXAMIC ACID-NACL 1000-0.7 MG/100ML-% IV SOLN
INTRAVENOUS | Status: AC
  Administered 2019-07-14: 1000 mg
  Filled 2019-07-14: qty 100

## 2019-07-14 MED ORDER — CEFAZOLIN SODIUM-DEXTROSE 2-4 GM/100ML-% IV SOLN
2.0000 g | Freq: Once | INTRAVENOUS | Status: AC
  Administered 2019-07-14: 15:00:00 2 g via INTRAVENOUS
  Filled 2019-07-14: qty 100

## 2019-07-14 MED ORDER — DIPHENOXYLATE-ATROPINE 2.5-0.025 MG PO TABS: 2.0000 | ORAL_TABLET | Freq: Once | ORAL | Status: DC

## 2019-07-14 MED ORDER — MISOPROSTOL 200 MCG PO TABS
ORAL_TABLET | ORAL | Status: AC
  Filled 2019-07-14: qty 2

## 2019-07-14 MED ORDER — MISOPROSTOL 200 MCG PO TABS
400.0000 ug | ORAL_TABLET | Freq: Once | ORAL | Status: AC
  Administered 2019-07-14: 400 ug via RECTAL

## 2019-07-14 MED ORDER — OXYTOCIN 40 UNITS IN NORMAL SALINE INFUSION - SIMPLE MED
1.0000 m[IU]/min | INTRAVENOUS | Status: DC
  Administered 2019-07-14: 2 m[IU]/min via INTRAVENOUS
  Filled 2019-07-14: qty 1000

## 2019-07-14 MED ORDER — WITCH HAZEL-GLYCERIN EX PADS: 1.0000 "application " | MEDICATED_PAD | CUTANEOUS | Status: DC | PRN

## 2019-07-14 MED ORDER — SIMETHICONE 80 MG PO CHEW: 80.0000 mg | CHEWABLE_TABLET | ORAL | Status: DC | PRN

## 2019-07-14 MED ORDER — MISOPROSTOL 200 MCG PO TABS
ORAL_TABLET | ORAL | Status: AC
  Filled 2019-07-14: qty 3

## 2019-07-14 MED ORDER — IBUPROFEN 600 MG PO TABS
600.0000 mg | ORAL_TABLET | Freq: Four times a day (QID) | ORAL | Status: DC
  Administered 2019-07-14 – 2019-07-16 (×8): 600 mg via ORAL
  Filled 2019-07-14 (×8): qty 1

## 2019-07-14 MED ORDER — DIPHENHYDRAMINE HCL 25 MG PO CAPS: 25.0000 mg | ORAL_CAPSULE | Freq: Four times a day (QID) | ORAL | Status: DC | PRN

## 2019-07-14 MED ORDER — ZOLPIDEM TARTRATE 5 MG PO TABS: 5.0000 mg | ORAL_TABLET | Freq: Every evening | ORAL | Status: DC | PRN

## 2019-07-14 MED ORDER — ONDANSETRON HCL 4 MG PO TABS: 4.0000 mg | ORAL_TABLET | ORAL | Status: DC | PRN

## 2019-07-14 MED ORDER — PRENATAL MULTIVITAMIN CH
1.0000 | ORAL_TABLET | Freq: Every day | ORAL | Status: DC
  Administered 2019-07-15 – 2019-07-16 (×2): 1 via ORAL
  Filled 2019-07-14 (×2): qty 1

## 2019-07-14 MED ORDER — LACTATED RINGERS IV BOLUS: 1000.0000 mL | Freq: Once | INTRAVENOUS | Status: DC

## 2019-07-14 NOTE — Progress Notes (Signed)
LABOR PROGRESS NOTE  Judith Evans is a 19 y.o. G2P0010 at [redacted]w[redacted]d  admitted for IOL gHTN.  Subjective: She is feeling contractions but says they are bearable.  Denies chest pain, SOB, RUQ pain, LE edema  Objective: BP 122/87   Pulse (!) 113   Temp 99.1 F (37.3 C) (Oral)   Resp 18   LMP 10/21/2018 (Approximate)  or  Vitals:   07/14/19 0221 07/14/19 0337 07/14/19 0417 07/14/19 0502  BP: 132/88 120/70 129/79 122/87  Pulse: 100 88 97 (!) 113  Resp: 14 16 16 18   Temp: 99.1 F (37.3 C)     TempSrc: Oral        Dilation: 4 Effacement (%): 70 Station: -3 Presentation: Vertex Exam by:: [email or web address], RN FHT: baseline rate 140, moderate varibility, +acel, variable decels Toco: q2-4 min  Labs: Lab Results  Component Value Date   WBC 11.1 (H) 07/13/2019   HGB 11.2 (L) 07/13/2019   HCT 34.3 (L) 07/13/2019   MCV 93.5 07/13/2019   PLT 220 07/13/2019    Patient Active Problem List   Diagnosis Date Noted  . Encounter for induction of labor 07/13/2019  . 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 Chlamydia infection affecting pregnancy 05/24/2019  . History of miscarriage 05/24/2019  . Dysuria during pregnancy in second trimester 03/30/2019  . Abnormal ultrasonic finding on antenatal screening of mother 02/21/2019  . Personal history of other infectious and parasitic diseases 01/03/2019  . Primigravida, antepartum 01/02/2019  . Supervision of normal first pregnancy, antepartum 12/12/2018  . Cyst of right ovary 09/15/2018  . Absence of menstruation 03/17/2016  . Decreased appetite 01/18/2012    Assessment / Plan: 19 y.o. G2P0010 at [redacted]w[redacted]d here for IOL gHTN.  Labor: s/p miso x2. Still only 1cm on exam but SROM at ~2345. Contracting too much for another miso. FB placed 0040. FB out around 0317. Started pitocin at [phone].   Fetal Wellbeing:  Cat 2 Pain Control:  IV pain meds PRN, epidural upon request GBS: neg Anticipated MOD:  SVD  gHTN: normotensive over past several  hours, HA present on admission now resolve and otherwise asymptomatic. Labs notable only for a baseline Cr of 0.97, pending clinical course consider recheck in next 24h to ensure not rising.   [id number] MD, PGY1 OB Resident  07/14/2019, 5:35 AM

## 2019-07-14 NOTE — Progress Notes (Signed)
Judith Evans is a 19 y.o. G2P1011 s/p NSVD earlier today with increased bleeding on fundal massage  Subjective: Pt was nauseated and now feels better.  Not lightedheaded currently.    Objective: BP (!) 125/91   Pulse (!) 120   Temp 98.6 F (37 C)   Resp 18   Ht 5' (1.524 m)   Wt 63.1 kg   LMP 10/21/2018 (Approximate)   Breastfeeding Unknown   BMI 27.17 kg/m  No intake/output data recorded. Total I/O In: -  Out: 1690 [Urine:800; Blood:890]  Approx 250cc clot in uterus at fundus cleared Bladder scan 226 and unable to void  Labs: Lab Results  Component Value Date   WBC 11.1 (H) 07/13/2019   HGB 11.2 (L) 07/13/2019   HCT 34.3 (L) 07/13/2019   MCV 93.5 07/13/2019   PLT 220 07/13/2019    Assessment / Plan: Normal locia after manual removal of uterine clots.  400 misoprostol rectally (received 600 mg buccal earlier.  Rpt TXA.   In and out cath 250cc Pt requested a sip of soda Increase fluids, pitocin bolus, cbc, dic panel  RN to follow PPH protocol. Rescan bladder in 30 minutes; if increased and unable to void, place foley catheter.    Judith Evans 07/14/2019, 1:46 PM

## 2019-07-14 NOTE — Procedures (Addendum)
Post-Placental Nexplanon Insertion Procedure Note  Patient was identified. Informed consent was signed, signed copy in chart. A time-out was performed.    The insertion site was identified 8-10 cm (3-4 inches) from the medial epicondyle of the humerus and 3-5 cm (1.25-2 inches) posterior to (below) the sulcus (groove) between the biceps and triceps muscles of the patient's left arm and marked. The site was prepped and draped in the usual sterile fashion. Pt was prepped with alcohol swab and then injected with 2 cc of 1% lidocaine. The site was prepped with betadine. Nexplanon removed form packaging,  Device confirmed in needle, then inserted full length of needle and withdrawn per handbook instructions. Provider and patient verified presence of the implant in the woman's arm by palpation. Pt insertion site was covered with steristrips/adhesive bandage and pressure bandage. There was minimal blood loss. Patient tolerated procedure well.  Patient was given post procedure instructions and Nexplanon user card with expiration date. Condoms were recommended for STI prevention. Patient was asked to keep the pressure dressing on for 24 hours to minimize bruising and keep the adhesive bandage on for 3-5 days. The patient verbalized understanding of the plan of care and agrees.   Lot # [id number] Expiration Date 07/25/21  Aldean Jewett MD, PGY-1 OBGYN Faculty Teaching Service   I was immediately available for the procedure.  Jerilynn Birkenhead, MD [facility] Family Medicine Fellow, [facility] for [employer], [facility]

## 2019-07-14 NOTE — Discharge Summary (Signed)
Postpartum Discharge Summary      Patient Name: Judith Evans DOB: 2000-09-18 MRN: [id number]  Date of admission: 07/13/2019 Delivering Provider: Gavin Pound   Date of discharge: 07/16/2019  Admitting diagnosis: Encounter for induction of labor [Z34.90] Intrauterine pregnancy: [redacted]w[redacted]d    Secondary diagnosis:  Principal Problem:   Normal vaginal delivery Active Problems:   Supervision of normal first pregnancy, antepartum   Echogenic bowel of fetus   Abnormal ultrasonic finding on antenatal screening of mother   Cyst of right ovary   PPH (postpartum hemorrhage)   Preeclampsia  Additional problems: None     Discharge diagnosis: Term Pregnancy Delivered, Preeclampsia (mild), Anemia and PPH                                                                                                Post partum procedures: 1 unit blood transfusion and Nexplanon insertion  Augmentation: Pitocin, Cytotec and Foley Balloon  Complications: [id number] Hospital course:  Induction of Labor With Vaginal Delivery   19y.o. yo G2P0010 at 316w0das admitted to the hospital 07/13/2019 for induction of labor s/t gHTN.  PreEclampsia labs was normal, but creatinine noted to be elevated at 0.97.  Patient was given 3 doses of fentanyl and progressed to complete with pitocin.  Patient had delivery of a Viable female infant without lacerations.  Patient with some moderate bleeding after delivery and given 60047mof cytotec buccal.   Information for the patient's newborn:  GatDailey, Albersonrl Yaresly [[id number]]elivery Method: Vaginal, Spontaneous(Filed from Delivery Summary)    07/14/2019  Details of delivery can be found in separate delivery note.  Patient had a routine postpartum course. Hgb dropped to 6.0 and patient was given 1 unit RBC with improvement to 8.1. Will cont on PO iron. BP's monitored and WNL. Nexplanon inserted. Patient is discharged home 07/16/19. Delivery time: 11:52 AM    Magnesium  Sulfate received: No BMZ received: No Rhophylac:No MMR:No Transfusion:Yes  Physical exam  Vitals:   07/15/19 1026 07/15/19 1208 07/15/19 2029 07/16/19 0531  BP: 107/68 113/75 119/75 122/83  Pulse: 89 85 83 79  Resp: 18 16 16    Temp: 98.1 F (36.7 C) 97.8 F (36.6 C) 98.2 F (36.8 C) 98.2 F (36.8 C)  TempSrc:   Oral   SpO2: 100% 98% 99%   Weight:      Height:       General: alert, cooperative and no distress Lochia: appropriate Uterine Fundus: firm Incision: N/A DVT Evaluation: No evidence of DVT seen on physical exam. Labs: Lab Results  Component Value Date   WBC 22.0 (H) 07/15/2019   HGB 8.1 (L) 07/15/2019   HCT 23.6 (L) 07/15/2019   MCV 91.5 07/15/2019   PLT 168 07/15/2019   CMP Latest Ref Rng & Units 07/13/2019  Glucose 70 - 99 mg/dL 96  BUN 6 - 20 mg/dL 8  Creatinine 0.44 - 1.00 mg/dL 0.97  Sodium 135 - 145 mmol/L 137  Potassium 3.5 - 5.1 mmol/L 4.3  Chloride 98 - 111 mmol/L 108  CO2 22 - 32 mmol/L 18(L)  Calcium  8.9 - 10.3 mg/dL 8.5(L)  Total Protein 6.5 - 8.1 g/dL 6.4(L)  Total Bilirubin 0.3 - 1.2 mg/dL 0.8  Alkaline Phos 38 - 126 U/L 164(H)  AST 15 - 41 U/L 25  ALT 0 - 44 U/L 11   Edinburgh Score: No flowsheet data found.  Discharge instruction: per After Visit Summary and "Baby and Me Booklet".  After visit meds:  Allergies as of 07/16/2019   No Known Allergies     Medication List    STOP taking these medications   butalbital-acetaminophen-caffeine 50-325-40 MG tablet Commonly known as: FIORICET     TAKE these medications   acetaminophen 325 MG tablet Commonly known as: Tylenol Take 2 tablets (650 mg total) by mouth every 6 (six) hours as needed (for pain scale < 4). What changed:   medication strength  how much to take  reasons to take this   Blood Pressure Kit Devi 1 kit by Does not apply route once a week. Check Blood Pressure regularly and record readings into the Babyscripts App.  Large Cuff.  DX O90.0   cyclobenzaprine 5  MG tablet Commonly known as: FLEXERIL Take 1 tablet (5 mg total) by mouth every 8 (eight) hours as needed (headache backache).   ferrous sulfate 325 (65 FE) MG tablet Take 1 tablet (325 mg total) by mouth every other day.   ibuprofen 600 MG tablet Commonly known as: ADVIL Take 1 tablet (600 mg total) by mouth every 6 (six) hours.   PrePLUS 27-1 MG Tabs Take 1 tablet by mouth daily.   senna-docusate 8.6-50 MG tablet Commonly known as: Senokot-S Take 2 tablets by mouth daily. Start taking on: July 17, 2019       Diet: routine diet  Activity: Advance as tolerated. Pelvic rest for 6 weeks.   Outpatient follow up:4 weeks Follow up Appt:No future appointments. Follow up Visit:    Please schedule this patient for Postpartum visit in: 4 weeks with the following provider: Any provider Virtual For C/S patients schedule nurse incision check in weeks 2 weeks: no High risk pregnancy complicated by: HTN Delivery mode:  SVD Anticipated Birth Control:  Nexplanon PP Procedures needed: BP check  Schedule Integrated BH visit: no     Newborn Data: Live born female-Asyah Birth Weight: 2560g  APGAR: 46, 9  Newborn Delivery   Birth date/time: 07/14/2019 11:52:00 Delivery type: Vaginal, Spontaneous      Baby Feeding: Bottle Disposition:home with mother   07/16/2019 Chauncey Mann, MD

## 2019-07-14 NOTE — Progress Notes (Signed)
LABOR PROGRESS NOTE  Judith Evans is a 19 y.o. G2P0010 at 102w6d  admitted for IOL gHTN.  Subjective: She is feeling contractions now after SROM.  Denies chest pain, SOB, RUQ pain, LE edema  Objective: BP 139/88   Pulse 90   Temp 98.8 F (37.1 C) (Oral)   Resp 15   LMP 10/21/2018 (Approximate)  or  Vitals:   07/13/19 2104 07/13/19 2209 07/13/19 2305 07/13/19 2348  BP: 118/63 107/63 117/77 139/88  Pulse: 89 90 88 90  Resp: 16 15 14 15   Temp:      TempSrc:         Dilation: 1 Effacement (%): 70 Station: -3 Presentation: Vertex Exam by:: Dr. [email or web address] FHT: baseline rate 140, moderate varibility, +acel, -decel Toco: q2-4 min  Labs: Lab Results  Component Value Date   WBC 11.1 (H) 07/13/2019   HGB 11.2 (L) 07/13/2019   HCT 34.3 (L) 07/13/2019   MCV 93.5 07/13/2019   PLT 220 07/13/2019    Patient Active Problem List   Diagnosis Date Noted  . Encounter for induction of labor 07/13/2019  . 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 Chlamydia infection affecting pregnancy 05/24/2019  . History of miscarriage 05/24/2019  . Dysuria during pregnancy in second trimester 03/30/2019  . Abnormal ultrasonic finding on antenatal screening of mother 02/21/2019  . Personal history of other infectious and parasitic diseases 01/03/2019  . Primigravida, antepartum 01/02/2019  . Supervision of normal first pregnancy, antepartum 12/12/2018  . Cyst of right ovary 09/15/2018  . Absence of menstruation 03/17/2016  . Decreased appetite 01/18/2012    Assessment / Plan: 19 y.o. G2P0010 at [redacted]w[redacted]d here for IOL gHTN.  Labor: s/p miso x2. Still only 1cm on exam but SROM at ~2345. Contracting too much for another miso. FB placed 0040. Consider pitocin as indicated.  Fetal Wellbeing:  Cat I Pain Control:  IV pain meds PRN, epidural upon request GBS: neg Anticipated MOD:  SVD  gHTN: normotensive over past several hours, HA present on admission now resolve and otherwise asymptomatic. Labs  notable only for a baseline Cr of 0.97, pending clinical course consider recheck in next 24h to ensure not rising.   [redacted]w[redacted]d MD, PGY1 OB Resident  07/14/2019, 12:45 AM

## 2019-07-14 NOTE — Progress Notes (Signed)
Judith Evans MRN: [id number]  Subjective: -Care assumed of 19 y.o. G2P0010 at [redacted]w[redacted]d who presents for IOL s/t gHTN. Pregnancy and medical history significant for h/o SAB, Echogenic bowel of fetus, right ovarian cyst, and CT during pregnancy with negative TOC.  In room to meet acquaintance of patient and family.  Patient reports comfort for now and denies any HA, VD, or epigastric pain.  Patient endorses fetal movement.   Objective: BP 111/72   Pulse 95   Temp 99.1 F (37.3 C) (Oral)   Resp 14   Ht 5' (1.524 m)   Wt 63.1 kg   LMP 10/21/2018 (Approximate)   BMI 27.17 kg/m  No intake/output data recorded. No intake/output data recorded.  Fetal Monitoring: FHT: 145 bpm, Mod Var, +Early Decels, -Accels UC: Q4-70min    Vaginal Exam: SVE:   Dilation: 4.5 Effacement (%): 70 Station: -1 Exam by:: Lorn Junes, RN Membranes:SROM x 9hrs Internal Monitors: None  Augmentation/Induction: Pitocin:13mUn/min Cytotec: S/P Foley Bulb: S/P  Assessment:  IUP at 38 weeks Cat I FT  IOL gHTN GBS Negative aFebrile  Plan: -Patient encouraged to rest. -Will continue to monitor.   Valma Cava, CNM Advanced Practice Provider, Center for [facility] 07/14/2019, 8:38 AM

## 2019-07-14 NOTE — Progress Notes (Signed)
Shekira Drummer MRN: [id number]  Subjective: -Nurse called reporting patient with rectal pressure and C/C upon vaginal exam.  Provider to bedside and patient confirms rectal pressure stating "I was sleeping."   Objective: BP (!) 139/56   Pulse 100   Temp 98.6 F (37 C)   Resp 18   Ht 5' (1.524 m)   Wt 63.1 kg   LMP 10/21/2018 (Approximate)   BMI 27.17 kg/m  No intake/output data recorded. No intake/output data recorded.  Fetal Monitoring: FHT: 120 bpm, Mod Var, -Decels, +Accels UC: Q2-20min    Vaginal Exam: SVE:   Dilation: 10 Effacement (%): 70 Station: Plus 2 Exam by:: Lorn Junes, RN Membranes:SROM x 11hrs Internal Monitors: None  Augmentation/Induction: Pitocin:31mUn/min Cytotec: S/P  Assessment:  IUP at 38 weeks Cat I FT  Stage 2 Labor  Plan: -Room prepared for delivery -Anticipate SVD  Valma Cava, CNM Advanced Practice Provider, Center for [facility] 07/14/2019, 11:29 AM

## 2019-07-15 LAB — CBC
HCT: 18.3 % — ABNORMAL LOW (ref 36.0–46.0)
HCT: 23.6 % — ABNORMAL LOW (ref 36.0–46.0)
Hemoglobin: 6 g/dL — CL (ref 12.0–15.0)
Hemoglobin: 8.1 g/dL — ABNORMAL LOW (ref 12.0–15.0)
MCH: 30.8 pg (ref 26.0–34.0)
MCH: 31.4 pg (ref 26.0–34.0)
MCHC: 32.8 g/dL (ref 30.0–36.0)
MCHC: 34.3 g/dL (ref 30.0–36.0)
MCV: 93.8 fL (ref 80.0–100.0)
MCV: 91.5 fL (ref 80.0–100.0)
Platelets: 169 10*3/uL (ref 150–400)
Platelets: 168 10*3/uL (ref 150–400)
RBC: 1.95 MIL/uL — ABNORMAL LOW (ref 3.87–5.11)
RBC: 2.58 MIL/uL — ABNORMAL LOW (ref 3.87–5.11)
RDW: 12.8 % (ref 11.5–15.5)
RDW: 13.2 % (ref 11.5–15.5)
WBC: 24.4 10*3/uL — ABNORMAL HIGH (ref 4.0–10.5)
WBC: 22 10*3/uL — ABNORMAL HIGH (ref 4.0–10.5)
nRBC: 0 % (ref 0.0–0.2)
nRBC: 0 % (ref 0.0–0.2)

## 2019-07-15 LAB — PREPARE RBC (CROSSMATCH)

## 2019-07-15 MED ORDER — SODIUM CHLORIDE 0.9% IV SOLUTION
Freq: Once | INTRAVENOUS | Status: AC
  Administered 2019-07-15: 10 mL/h via INTRAVENOUS

## 2019-07-15 NOTE — Lactation Note (Signed)
This note was copied from a baby's chart. Lactation Consultation Note  Patient Name: Judith Evans [id number] Date: 07/15/2019 Reason for consult: Initial assessment;Early term 37-38.6wks;Primapara;1st time breastfeeding;Infant < 6lbs  P1 mother whose infant is now 39 hours old.  This is an ETI at 38+0 weeks weighing < 6 lbs.  Mother's original feeding plan was to formula feed, however, she now has an interest in doing breast/bottle.  Mother had finished breast feeding when I arrived and was ready to give the formula supplement.  Similac 22 calorie will be used as the supplementation.  Baby is using the gold slow flow nipple.  Observed mother feeding and had to educate on paced bottle feeding.  Discussed effective burping during and after feeding.  Observed mother burping and provided helpful hints to support head/neck.  Mother will supplement with at least 15 mls after breast feeding per guidelines.  Discussed the ETI and the relationship to breast feeding.  Asked mother to observe for feeding cues and reminded her to awaken baby to feed at least every three hours if she does not self awaken.  Encouraged always feeding STS and offering the breast prior to supplementation.  Mother will limit total feeding time to 30 minutes.  She will ask for latch assistance as needed.  Mother feels like baby is latching easily and denies pain with feedings.    No support person currently present.  RN updated.   Maternal Data Formula Feeding for Exclusion: Yes Reason for exclusion: Mother's choice to formula and breast feed on admission Has patient been taught Hand Expression?: Yes Does the patient have breastfeeding experience prior to this delivery?: No  Feeding Feeding Type: Breast Fed  LATCH Score Latch: Grasps breast easily, tongue down, lips flanged, rhythmical sucking.  Audible Swallowing: Spontaneous and intermittent  Type of Nipple: Everted at rest and after stimulation  Comfort  (Breast/Nipple): Soft / non-tender  Hold (Positioning): No assistance needed to correctly position infant at breast.  LATCH Score: 10  Interventions    Lactation Tools Discussed/Used     Consult Status Consult Status: Follow-up Date: 07/16/19 Follow-up type: In-patient    Daanya Lanphier R Shelbe Haglund 07/15/2019, 2:42 PM

## 2019-07-15 NOTE — Progress Notes (Signed)
Called post-transfusion CBC results to MD;  No new orders.

## 2019-07-15 NOTE — Progress Notes (Signed)
Critical lab value; hemoglobin 6.0. Dr. Raynald Kemp made aware; new orders given; will continue to monitor.

## 2019-07-15 NOTE — Progress Notes (Addendum)
POSTPARTUM PROGRESS NOTE  Post Partum Day 1  Subjective:  Loral Christensen is a 19 y.o. G2P1011 s/p SVD at [redacted]w[redacted]d.  She reports she is doing well. No acute events overnight. She denies any problems with ambulating, voiding or po intake. Denies nausea or vomiting.  Pain is well controlled.  Lochia is appropriate and improving.  Objective: Blood pressure 129/79, pulse (!) 114, temperature 99.6 F (37.6 C), temperature source Oral, resp. rate 18, height 5' (1.524 m), weight 63.1 kg, last menstrual period 10/21/2018, SpO2 100 %, unknown if currently breastfeeding.  Physical Exam:  General: alert, cooperative and no distress Chest: no respiratory distress Heart:regular rate, distal pulses intact Abdomen: soft, nontender Uterine Fundus: firm, appropriately tender DVT Evaluation: No calf swelling or tenderness Extremities: no edema Skin: warm, dry  Recent Labs    07/14/19 1412 07/15/19 0439  HGB 8.0* 6.0*  HCT 25.2* 18.3*    Assessment/Plan: Antoinette Ricklefs is a 19 y.o. G2P1011 s/p SVD at [redacted]w[redacted]d   PPD#1 - Doing well. Continue routine postpartum care. PPH/anemia: Hgb 11.2>8>6. Will transfuse 1U pRBC due to O type blood shortage at the blood bank and then recheck CBC. If it is still low, then the blood bank can release a second unit. gHTN: Not currently on medication. Mostly normal range blood pressures since delivery. One BP of 132/86 yesterday evening.   Contraception: Nexplanon placed Feeding: Bottle Dispo: Plan for discharge tomorrow.   LOS: 2 days    Aldean Jewett MD, PGY-1 OBGYN Faculty Teaching Service  07/15/2019, 6:49 AM   I saw and evaluated the patient. I agree with the findings and the plan of care as documented in the resident's note. Will hold on anti-hypertensive medication at this time. Continue to monitor throughout today. Repeat CBC pending for post-blood transfusion.   Jerilynn Birkenhead, MD [facility] Fellow, [facility] for [employer], [facility]

## 2019-07-16 DIAGNOSIS — O1490 Unspecified pre-eclampsia, unspecified trimester: Secondary | ICD-10-CM

## 2019-07-16 LAB — TYPE AND SCREEN
ABO/RH(D): O POS
Antibody Screen: NEGATIVE
Unit division: 0

## 2019-07-16 LAB — BPAM RBC
Blood Product Expiration Date: 202104082359
ISSUE DATE / TIME: 202103131001
Unit Type and Rh: 5100

## 2019-07-16 MED ORDER — IBUPROFEN 600 MG PO TABS: 600.0000 mg | ORAL_TABLET | Freq: Four times a day (QID) | ORAL | 0 refills | Status: AC

## 2019-07-16 MED ORDER — PREPLUS 27-1 MG PO TABS: 1.0000 | ORAL_TABLET | Freq: Every day | ORAL | 13 refills | Status: AC

## 2019-07-16 MED ORDER — FERROUS SULFATE 325 (65 FE) MG PO TABS
325.0000 mg | ORAL_TABLET | ORAL | Status: DC
  Administered 2019-07-16: 325 mg via ORAL
  Filled 2019-07-16: qty 1

## 2019-07-16 MED ORDER — FERROUS SULFATE 325 (65 FE) MG PO TABS: 325.0000 mg | ORAL_TABLET | ORAL | 0 refills | Status: AC

## 2019-07-16 MED ORDER — SENNOSIDES-DOCUSATE SODIUM 8.6-50 MG PO TABS: 2.0000 | ORAL_TABLET | ORAL | 0 refills | Status: AC

## 2019-07-16 MED ORDER — ACETAMINOPHEN 325 MG PO TABS: 650.0000 mg | ORAL_TABLET | Freq: Four times a day (QID) | ORAL | 0 refills | Status: AC | PRN

## 2019-08-14 ENCOUNTER — Telehealth (INDEPENDENT_AMBULATORY_CARE_PROVIDER_SITE_OTHER): Payer: Self-pay

## 2019-08-14 DIAGNOSIS — Z3400 Encounter for supervision of normal first pregnancy, unspecified trimester: Secondary | ICD-10-CM

## 2019-08-14 NOTE — Progress Notes (Addendum)
No answer for PP intake x 2. Unable to leave message as VM Box is not set up

## 2019-08-16 ENCOUNTER — Telehealth (INDEPENDENT_AMBULATORY_CARE_PROVIDER_SITE_OTHER): Payer: Medicaid Other | Admitting: Obstetrics

## 2019-08-16 ENCOUNTER — Encounter: Payer: Self-pay | Admitting: Obstetrics

## 2019-08-16 DIAGNOSIS — Z3046 Encounter for surveillance of implantable subdermal contraceptive: Secondary | ICD-10-CM

## 2019-08-16 NOTE — Progress Notes (Signed)
    Post Partum Visit Note  Judith Evans is a 19 y.o. G42P1011 female who presents for a postpartum visit. She is 4 weeks postpartum following a normal spontaneous vaginal delivery.  I have fully reviewed the prenatal and intrapartum course. The delivery was at 38 gestational weeks.  Anesthesia: IV sedation. Postpartum course has been doing well. Baby is doing well doing well. Baby is feeding by bottle - Similac Neosure. Bleeding thin lochia. Bowel function is normal. Bladder function is normal. Patient is not sexually active. Contraception method is Nexplanon.  Postpartum depression screening: negative, score 0.  The following portions of the patient's history were reviewed and updated as appropriate: allergies, current medications, past family history, past medical history, past social history, past surgical history and problem list.  Review of Systems A comprehensive review of systems was negative.    Objective:  unknown if currently breastfeeding.  General:  alert and no distress                  Respiratory:  Normal breathing effor                 Mental:  Normal mood and affect  The remainder of the exam deferred due to nature of the visit.  Assessment:    1. Postpartum care following vaginal delivery  2. Encounter for surveillance of Nexplanon subdermal contraceptive - doing well  Plan:   Essential components of care per ACOG recommendations:  1.  Mood and well being: Patient with negative depression screening today. Reviewed local resources for support.  - Patient does not use tobacco.  - hx of drug use? No    2. Infant care and feeding:  -Patient currently breastmilk feeding? No  -Social determinants of health (SDOH) reviewed in EPIC. No concerns  3. Sexuality, contraception and birth spacing - Patient does not want a pregnancy in the next year.  Desired family size is unknown children.  - Reviewed forms of contraception in tiered fashion. Patient received Nexplanon  placement prior to discharge - Discussed birth spacing of 18 months  4. Sleep and fatigue -Encouraged family/partner/community support of 4 hrs of uninterrupted sleep to help with mood and fatigue  5. Physical Recovery  - Discussed patients deliveryvaginally and complications - Patient had a no lacerations.  6.  Health Maintenance - Last pap smear:  n/a  7. No Chronic Disease   Coral Ceo, MD [facility], [facility] 08/16/2019

## 2019-08-30 ENCOUNTER — Ambulatory Visit: Payer: Medicaid Other | Admitting: Obstetrics

## 2021-02-25 IMAGING — US US PELVIS COMPLETE TRANSABD/TRANSVAG W DUPLEX
1 series · 13 of 25 positions shown · non-contrast
Comparison: Pelvic ultrasound dated 09/20/2018

CLINICAL DATA: 17-year-old female with pelvic pain x2 days.

EXAM:
TRANSABDOMINAL AND TRANSVAGINAL ULTRASOUND OF PELVIS
DOPPLER ULTRASOUND OF OVARIES
TECHNIQUE: Both transabdominal and transvaginal ultrasound examinations of the
pelvis were performed. Transabdominal technique was performed for
global imaging of the pelvis including uterus, ovaries, adnexal
regions, and pelvic cul-de-sac.
It was necessary to proceed with endovaginal exam following the
transabdominal exam to visualize the endometrium and ovaries. Color
and duplex Doppler ultrasound was utilized to evaluate blood flow to
the ovaries.

[Series 1: us pelvis complete transabd/transvag w duplex · 13 of 46 slices shown]
[im 1/46]
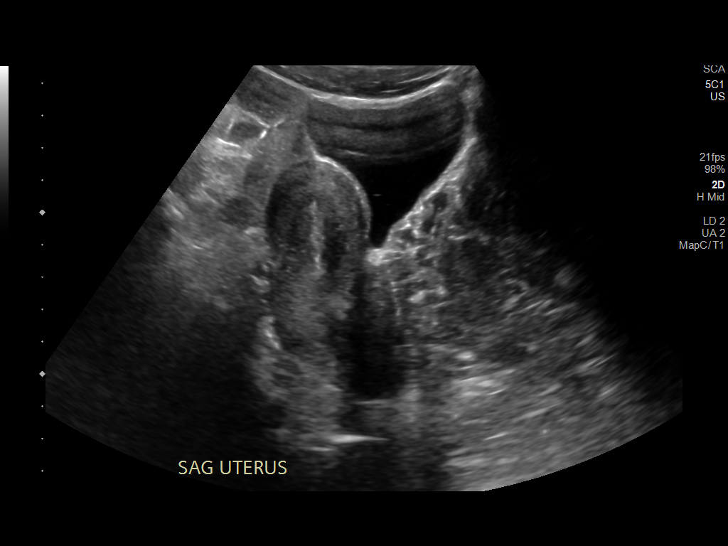
[im 4/46]
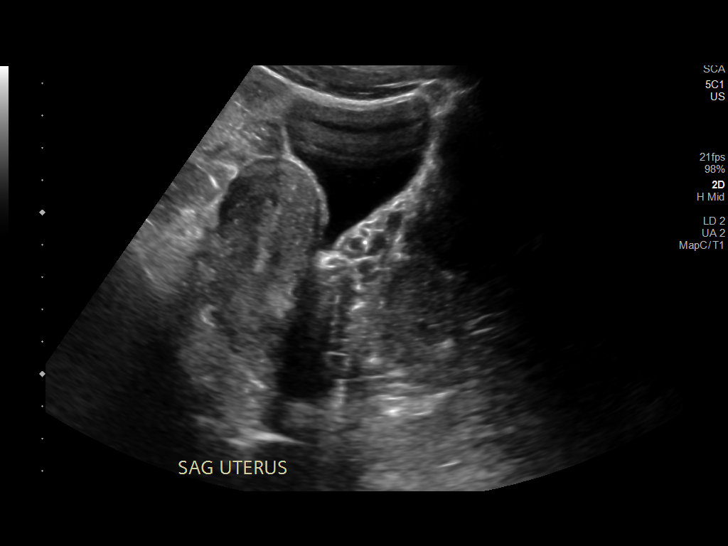
[im 8/46]
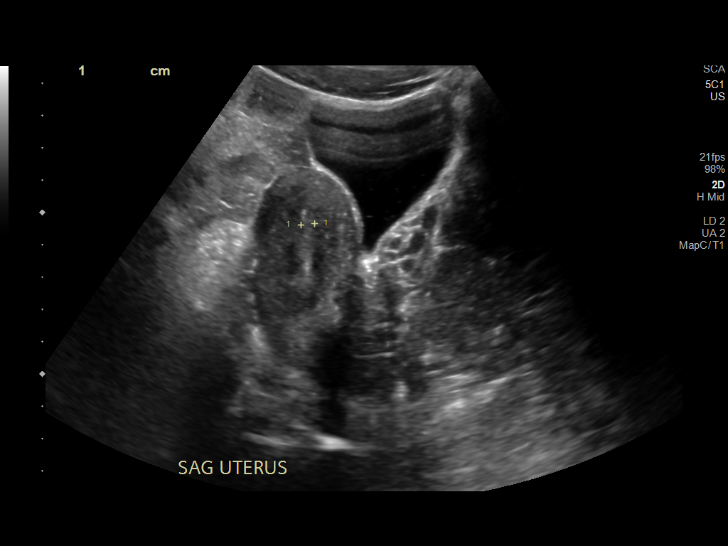
[im 12/46]
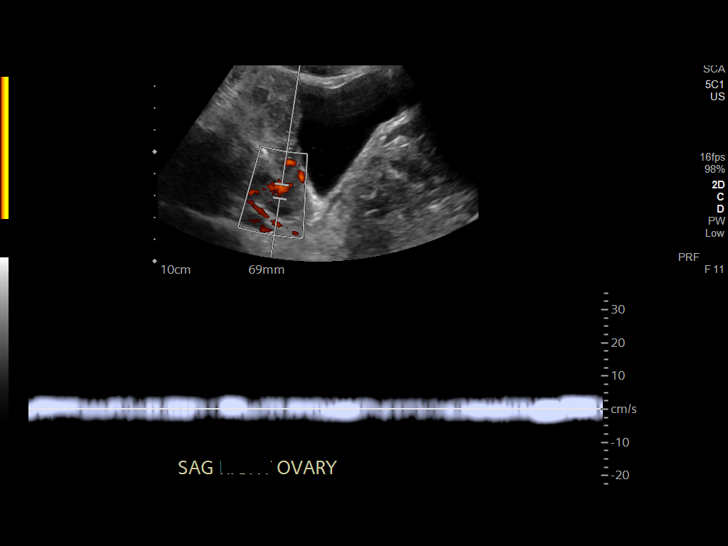
[im 16/46]
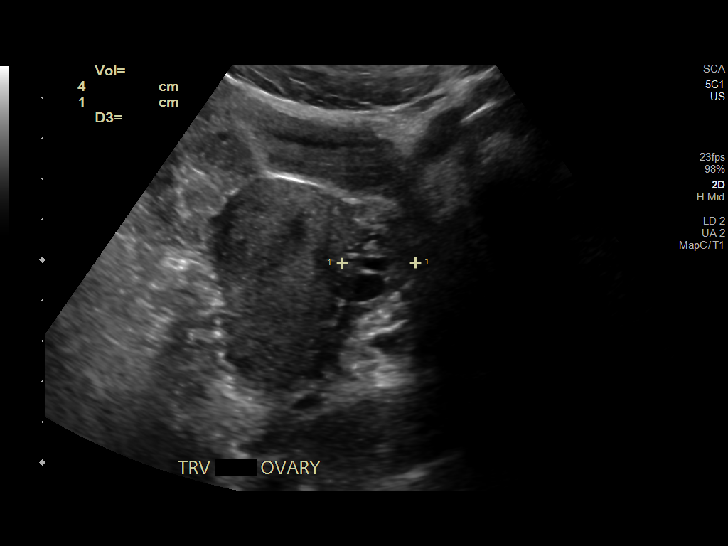
[im 19/46]
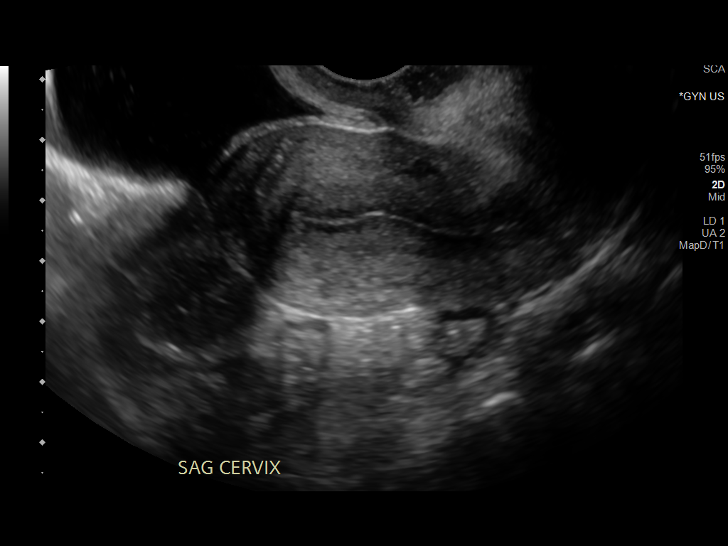
[im 23/46]
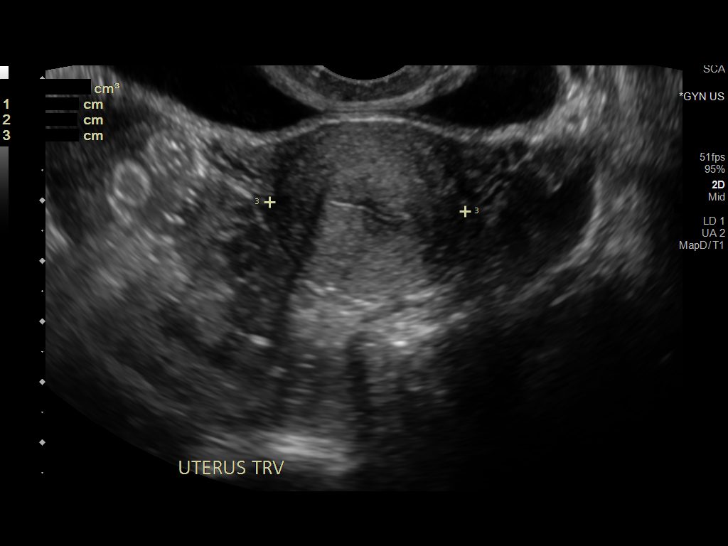
[im 27/46]
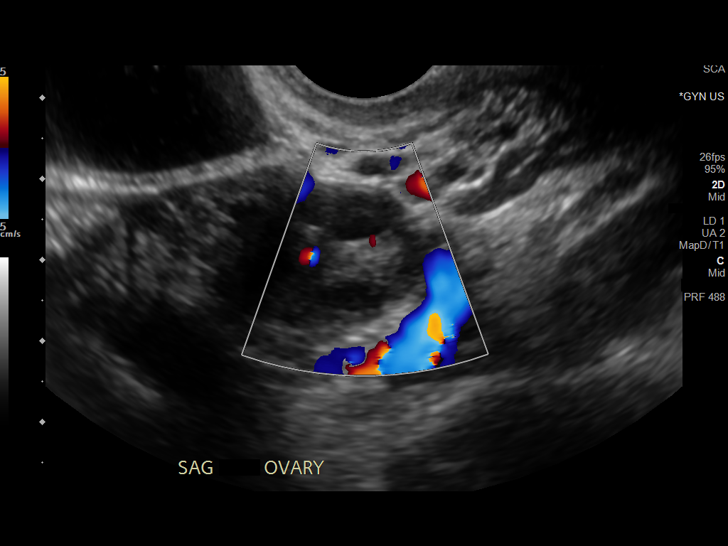
[im 31/46]
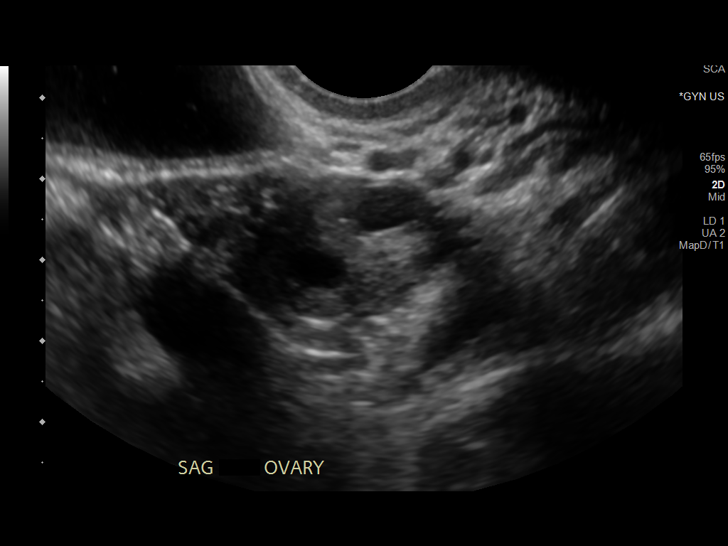
[im 34/46]
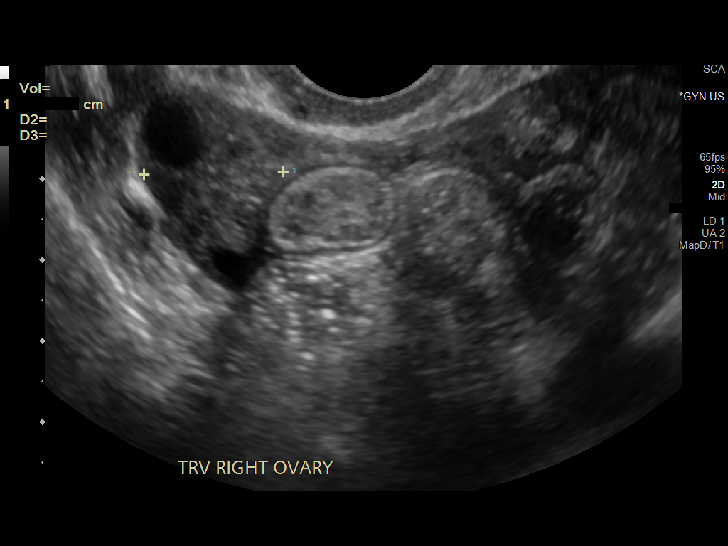
[im 38/46]
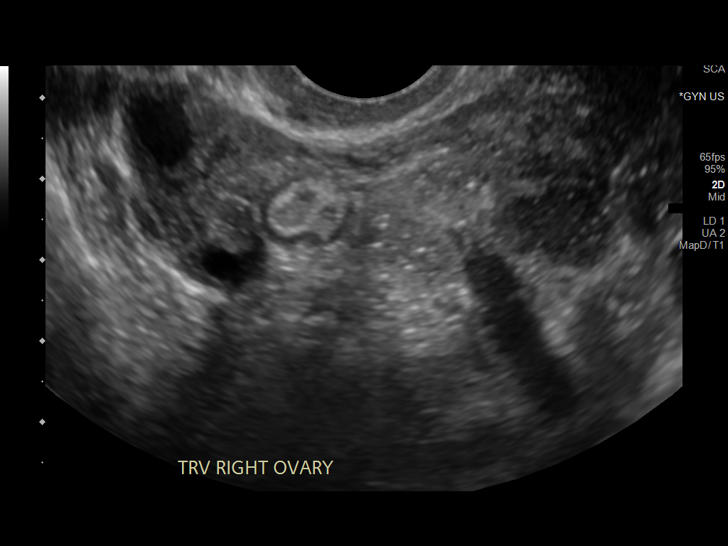
[im 42/46]
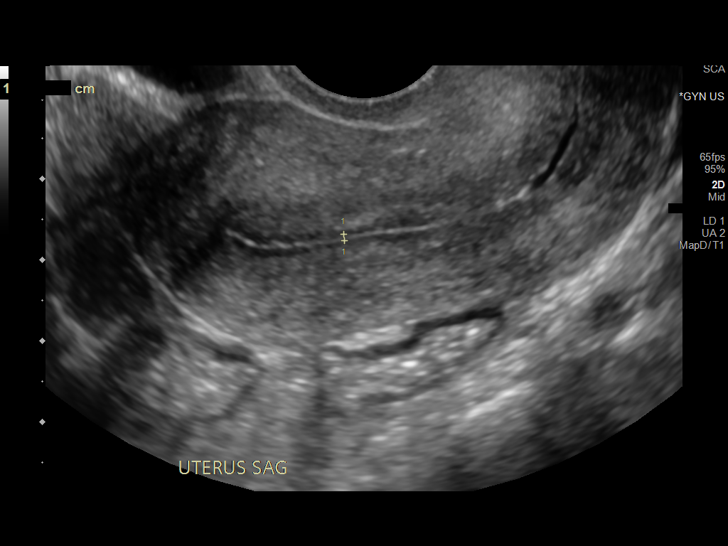
[im 46/46]
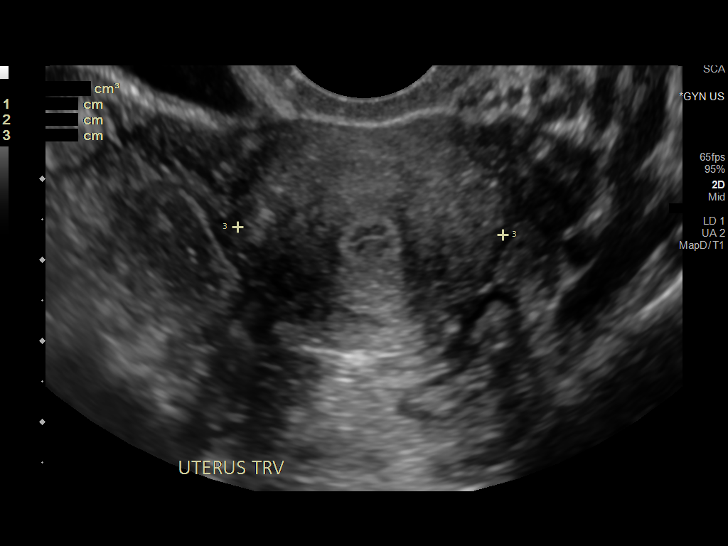

[13 of 25 positions shown; findings below may reference images not displayed]

FINDINGS: Uterus

Measurements: 6.5 x 3.3 x 3.3 cm = volume: 36 mL. No fibroids or
other mass visualized.

Endometrium

Thickness: 9 mm.  No focal abnormality visualized.

Right ovary

Measurements: 2.5 x 1.7 x 1.7 cm = volume: 3.9 mL. Normal
appearance/no adnexal mass.

Left ovary

Measurements: 2.4 x 1.8 x 2.0 cm = volume: 3.6 mL. Normal
appearance/no adnexal mass.

Pulsed Doppler evaluation of both ovaries demonstrates normal
low-resistance arterial and venous waveforms.

Other findings

No abnormal free fluid.
IMPRESSION: Unremarkable pelvic ultrasound.

## 2021-05-01 IMAGING — US OBSTETRIC <14 WK ULTRASOUND
1 series · 15 of 17 positions shown · non-contrast
Comparison: 11/29/2018

CLINICAL DATA: Abdominal pain

EXAM:
OBSTETRIC <14 WK ULTRASOUND
TECHNIQUE: Transabdominal ultrasound was performed for evaluation of the
gestation as well as the maternal uterus and adnexal regions.

[Series 1: obstetric <14 wk ultrasound · 15 of 17 slices shown]
[im 1/17]
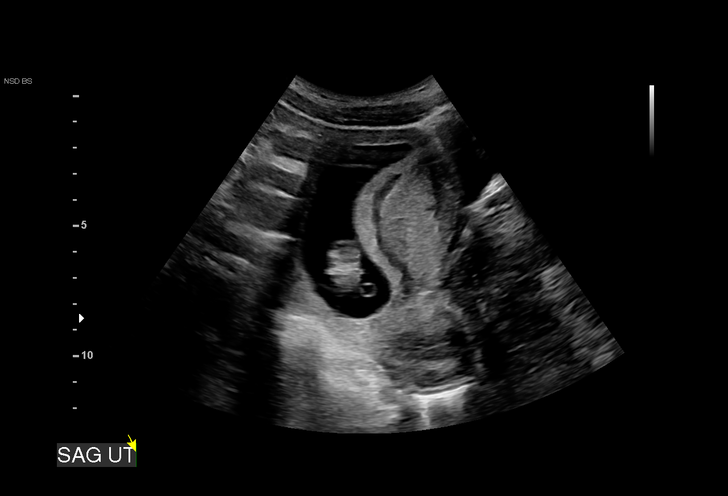
[im 2/17]
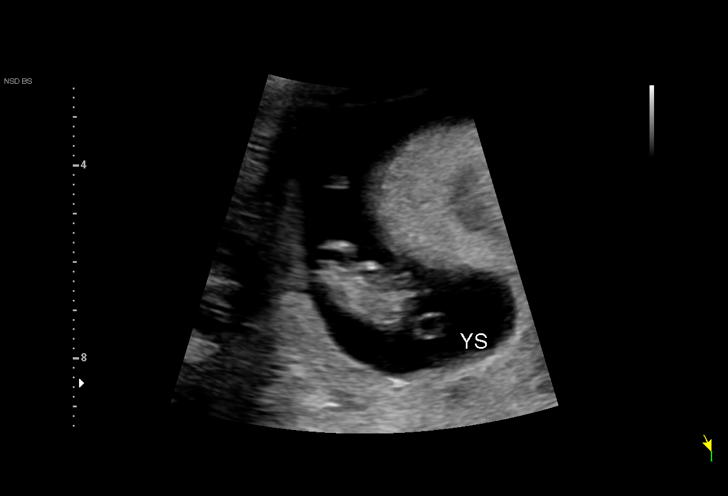
[im 3/17]
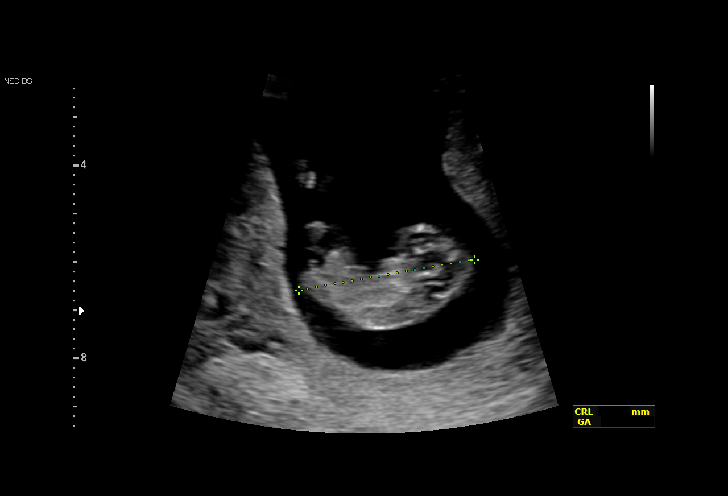
[im 4/17]
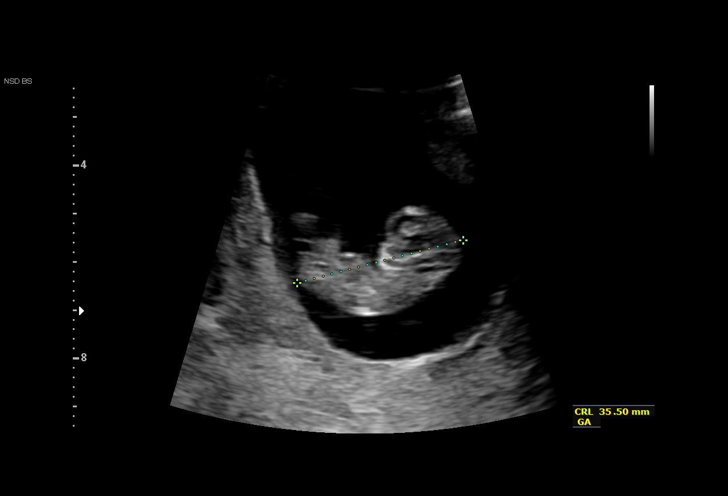
[im 6/17]
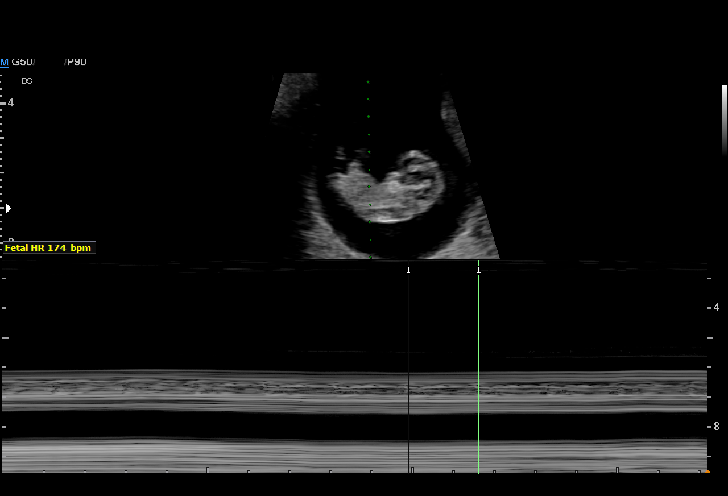
[im 7/17]
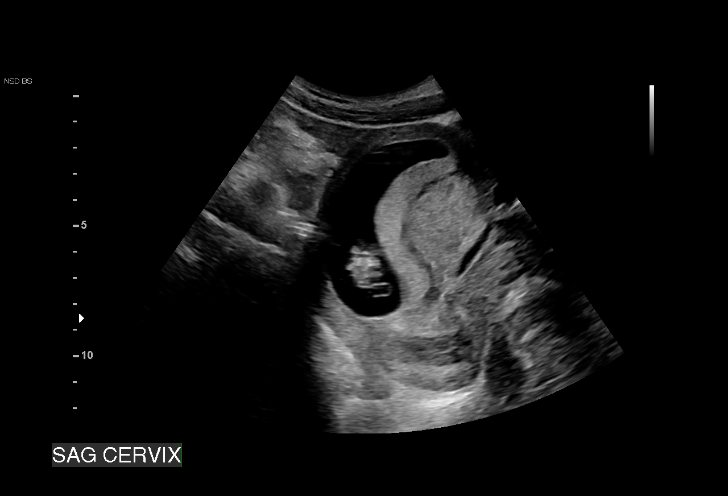
[im 8/17]
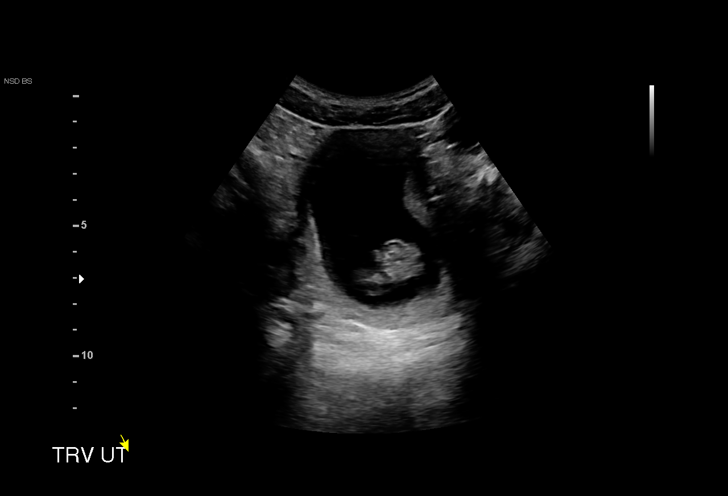
[im 9/17]
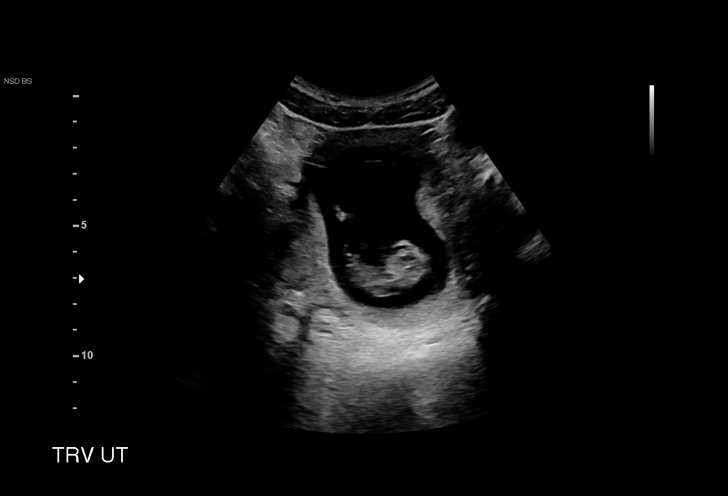
[im 10/17]
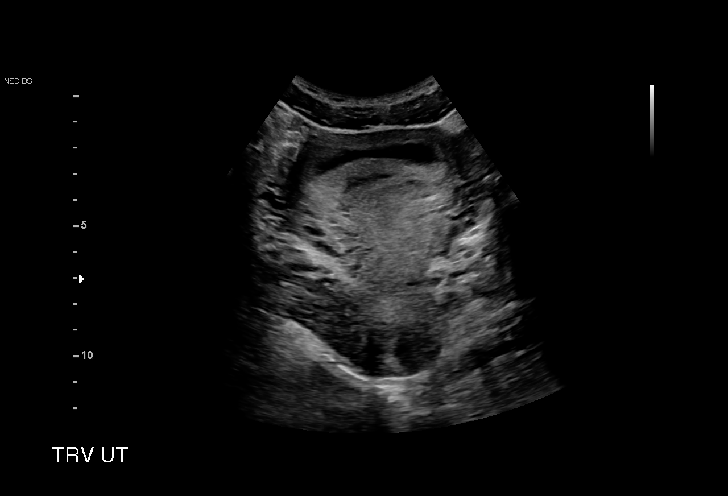
[im 11/17]
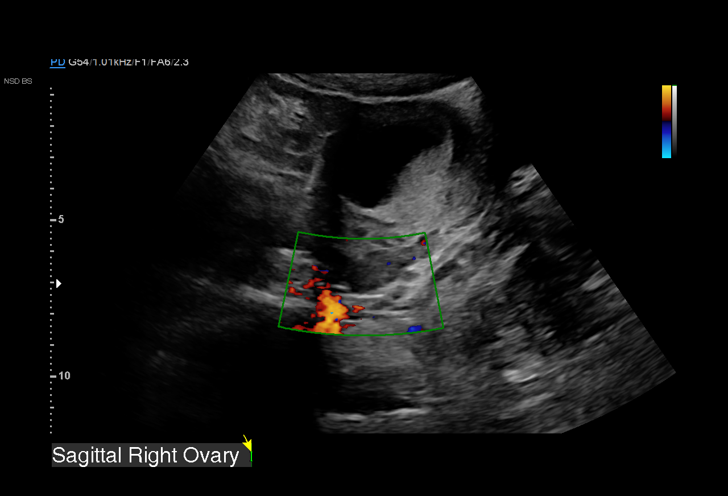
[im 12/17]
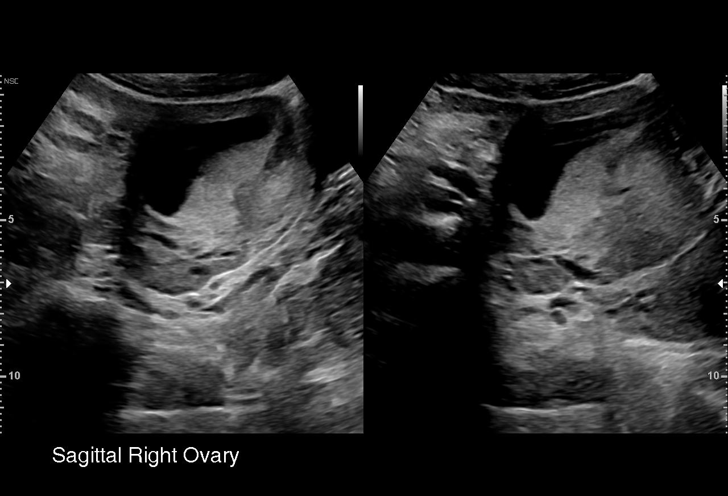
[im 14/17]
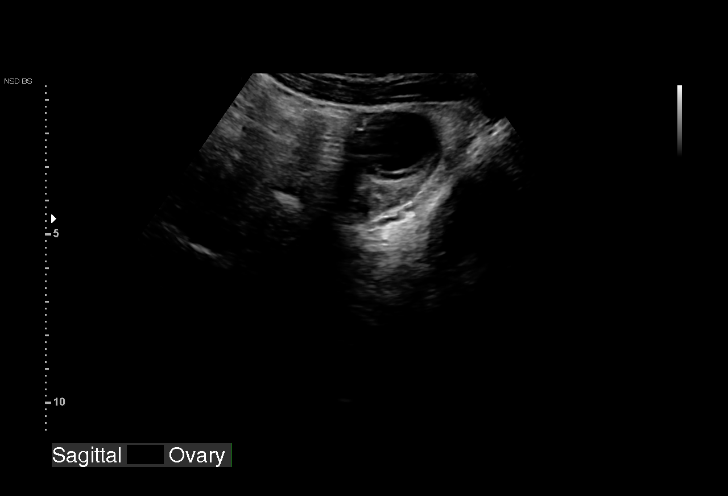
[im 15/17]
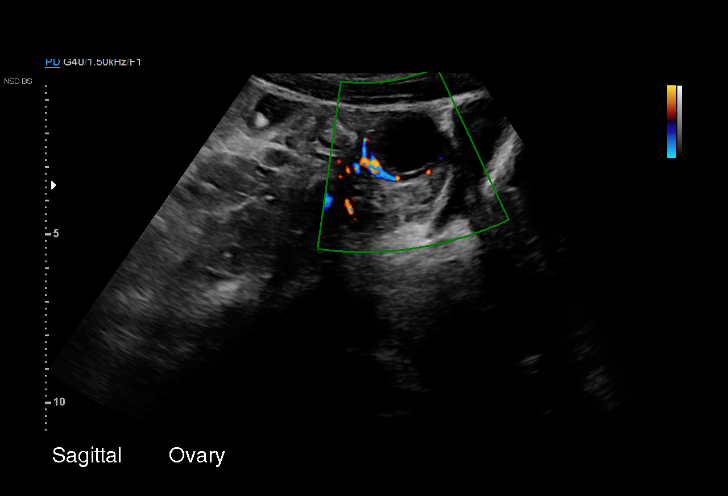
[im 16/17]
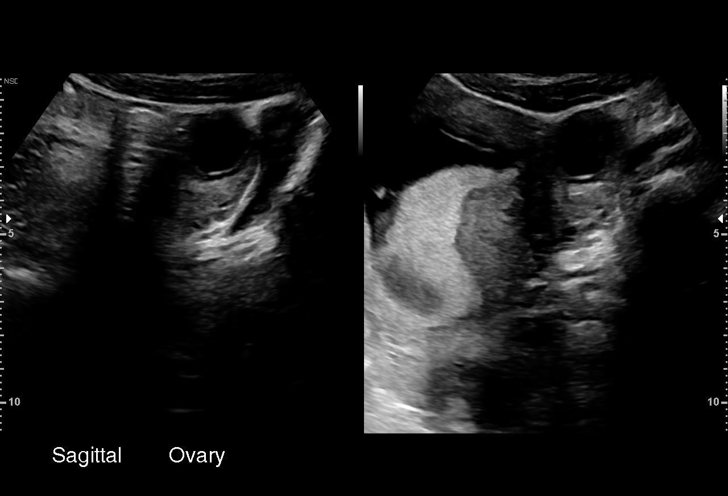
[im 17/17]
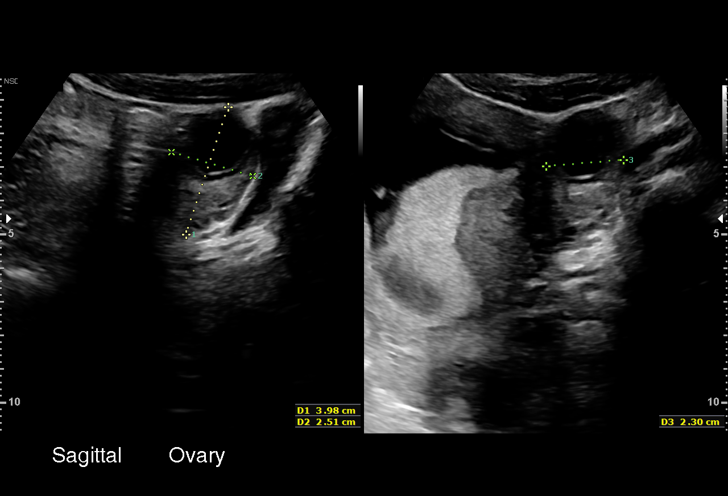

[15 of 17 positions shown; findings below may reference images not displayed]

FINDINGS: Intrauterine gestational sac: Single

Yolk sac:  Visualized.

Embryo:  Visualized.

Cardiac Activity: Visualized.

Heart Rate: 174 bpm

CRL: 34.9 mm   10 w 2 d                  US EDC: 07/28/2019

Subchorionic hemorrhage:  None visualized.

Maternal uterus/adnexae: Bilateral ovaries are within normal limits.
The left ovary measures 4 x 2.5 x 2.3 cm. The right ovary measures
3.1 x 1.5 x 2 cm. No significant free fluid.
IMPRESSION: Single viable intrauterine pregnancy.  No abnormality is visualized.
